# Patient Record
Sex: Male | Born: 1990 | Hispanic: Yes | Marital: Married | Smoking: Never smoker
Health system: Southern US, Community
[De-identification: ages and names within clinical notes are randomized; demographics above are authoritative.]

## PROBLEM LIST (undated history)

## (undated) DIAGNOSIS — K219 Gastro-esophageal reflux disease without esophagitis: Secondary | ICD-10-CM

## (undated) HISTORY — PX: LASIK: SHX215

## (undated) HISTORY — PX: KNEE ARTHROSCOPY: SUR90

---
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
## 2014-01-30 ENCOUNTER — Emergency Department (HOSPITAL_BASED_OUTPATIENT_CLINIC_OR_DEPARTMENT_OTHER): Payer: Commercial Managed Care - PPO

## 2014-01-30 ENCOUNTER — Emergency Department (HOSPITAL_BASED_OUTPATIENT_CLINIC_OR_DEPARTMENT_OTHER)
Admission: EM | Admit: 2014-01-30 | Discharge: 2014-01-30 | Disposition: A | Discharge status: Home / Self Care | Payer: Commercial Managed Care - PPO | Attending: Emergency Medicine | Admitting: Emergency Medicine

## 2014-01-30 ENCOUNTER — Encounter (HOSPITAL_BASED_OUTPATIENT_CLINIC_OR_DEPARTMENT_OTHER): Payer: Self-pay | Admitting: Emergency Medicine

## 2014-01-30 DIAGNOSIS — IMO0002 Reserved for concepts with insufficient information to code with codable children: Secondary | ICD-10-CM | POA: Insufficient documentation

## 2014-01-30 DIAGNOSIS — Y939 Activity, unspecified: Secondary | ICD-10-CM | POA: Insufficient documentation

## 2014-01-30 DIAGNOSIS — S8990XA Unspecified injury of unspecified lower leg, initial encounter: Secondary | ICD-10-CM

## 2014-01-30 DIAGNOSIS — Y9289 Other specified places as the place of occurrence of the external cause: Secondary | ICD-10-CM | POA: Insufficient documentation

## 2014-01-30 DIAGNOSIS — W230XXA Caught, crushed, jammed, or pinched between moving objects, initial encounter: Secondary | ICD-10-CM | POA: Insufficient documentation

## 2014-01-30 MED ORDER — OXYCODONE-ACETAMINOPHEN 5-325 MG PO TABS: 1.0000 | ORAL_TABLET | ORAL | Status: DC | PRN

## 2014-01-30 MED ORDER — IBUPROFEN 800 MG PO TABS: 800.0000 mg | ORAL_TABLET | Freq: Three times a day (TID) | ORAL | Status: DC

## 2014-01-30 MED ORDER — OXYCODONE-ACETAMINOPHEN 5-325 MG PO TABS
1.0000 | ORAL_TABLET | Freq: Once | ORAL | Status: AC
  Administered 2014-01-30: 1 via ORAL
  Filled 2014-01-30: qty 1
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
## 2015-09-23 ENCOUNTER — Ambulatory Visit: Payer: Worker's Compensation

## 2015-09-23 ENCOUNTER — Ambulatory Visit (INDEPENDENT_AMBULATORY_CARE_PROVIDER_SITE_OTHER): Payer: Worker's Compensation | Admitting: Family Medicine

## 2015-09-23 VITALS — BP 120/80 | HR 72 | Temp 98.2°F | Resp 18 | Ht 72.0 in | Wt 180.0 lb

## 2015-09-23 DIAGNOSIS — M25571 Pain in right ankle and joints of right foot: Secondary | ICD-10-CM

## 2015-09-23 DIAGNOSIS — S93401A Sprain of unspecified ligament of right ankle, initial encounter: Secondary | ICD-10-CM
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
## 2016-12-24 DIAGNOSIS — K219 Gastro-esophageal reflux disease without esophagitis: Secondary | ICD-10-CM | POA: Diagnosis not present

## 2017-04-02 DIAGNOSIS — J020 Streptococcal pharyngitis: Secondary | ICD-10-CM | POA: Diagnosis not present

## 2017-04-18 ENCOUNTER — Encounter: Payer: Self-pay | Admitting: Internal Medicine

## 2017-04-18 DIAGNOSIS — R12 Heartburn: Secondary | ICD-10-CM | POA: Diagnosis not present

## 2017-04-18 DIAGNOSIS — J3089 Other allergic rhinitis: Secondary | ICD-10-CM | POA: Diagnosis not present

## 2017-04-18 DIAGNOSIS — J029 Acute pharyngitis, unspecified: Secondary | ICD-10-CM | POA: Diagnosis not present

## 2017-06-04 ENCOUNTER — Ambulatory Visit (INDEPENDENT_AMBULATORY_CARE_PROVIDER_SITE_OTHER): Payer: 59 | Admitting: Internal Medicine

## 2017-06-04 ENCOUNTER — Encounter: Payer: Self-pay | Admitting: Internal Medicine

## 2017-06-04 VITALS — BP 108/72 | HR 72 | Ht 71.5 in | Wt 189.1 lb

## 2017-06-04 DIAGNOSIS — K219 Gastro-esophageal reflux disease without esophagitis: Secondary | ICD-10-CM | POA: Diagnosis not present

## 2017-06-04 MED ORDER — OMEPRAZOLE 40 MG PO CPDR: 40.0000 mg | DELAYED_RELEASE_CAPSULE | Freq: Every day | ORAL | 11 refills | Status: DC

## 2017-06-04 NOTE — Progress Notes (Signed)
HISTORY OF PRESENT ILLNESS:  Alexander Combs is a 26 y.o. male , field Hydrographic surveyorlaw enforcement officer, with no significant past medical history who presents today regarding problems with chronic GERD. The patient reports a 2-1/2 year history of significant GERD symptoms manifested by pyrosis, regurgitation, and occasional vomiting. Also associated throat clearing behavior. Had been using over-the-counter H2 receptor antagonist therapy without significant benefit. [facility] [address] and prescribed what sounds like PPI for 1 month. Told that if symptoms persisted or recurred thereafter to see a GI specialist. He arranges this appointment. Patient tells me that the medication was completely effective. He felt well. Recurrent symptoms off medication. He has symptoms daily and at night. No dysphagia. No weight loss. No family history of esophageal cancer. We did call the pharmacy that he recalls using to try to ascertain the exact medication, but they had no record. GI review of systems is otherwise negative  REVIEW OF SYSTEMS:  All non-GI ROS negative unless otherwise stated in the history of present illness  History reviewed. No pertinent past medical history.  Past Surgical History:  Procedure Laterality Date  . LASIK      Social History Hiroshi A Seydel  reports that he has quit smoking. He has never used smokeless tobacco. He reports that he drinks about 1.2 oz of alcohol per week . He reports that he does not use drugs.  family history includes Hypertension in his mother.  No Known Allergies     PHYSICAL EXAMINATION: Vital signs: BP 108/72   Pulse 72   Ht 5' 11.5" (1.816 m)   Wt 189 lb 2 oz (85.8 kg)   BMI 26.01 kg/m   Constitutional: generally well-appearing, no acute distress Psychiatric: alert and oriented x3, cooperative Eyes: extraocular movements intact, anicteric, conjunctiva pink Mouth: oral pharynx moist, no lesions Neck: supple no lymphadenopathy Cardiovascular:  heart regular rate and rhythm, no murmur Lungs: clear to auscultation bilaterally Abdomen: soft, nontender, nondistended, no obvious ascites, no peritoneal signs, normal bowel sounds, no organomegaly Rectal:Omitted Extremities: no clubbing cyanosis or lower extremity edema bilaterally Skin: no lesions on visible extremities Neuro: No focal deficits.   ASSESSMENT:  #1. Chronic GERD without alarm features   PLAN:  #1. Discussion on GERD with accompanying diagrams literature personally with the patient today #2. Reflux precautions. Reviewed #3. Prescribe omeprazole 40 mg daily. Multiple refills #4. GI follow-up 6 months. Contact the office in the interim for any questions or problems

## 2017-07-02 ENCOUNTER — Emergency Department (HOSPITAL_BASED_OUTPATIENT_CLINIC_OR_DEPARTMENT_OTHER)
Admission: EM | Admit: 2017-07-02 | Discharge: 2017-07-02 | Disposition: A | Discharge status: Home / Self Care | Payer: 59 | Attending: Emergency Medicine | Admitting: Emergency Medicine

## 2017-07-02 ENCOUNTER — Emergency Department (HOSPITAL_BASED_OUTPATIENT_CLINIC_OR_DEPARTMENT_OTHER): Payer: 59

## 2017-07-02 ENCOUNTER — Encounter (HOSPITAL_BASED_OUTPATIENT_CLINIC_OR_DEPARTMENT_OTHER): Payer: Self-pay

## 2017-07-02 DIAGNOSIS — Z87891 Personal history of nicotine dependence: Secondary | ICD-10-CM | POA: Insufficient documentation

## 2017-07-02 DIAGNOSIS — S52502A Unspecified fracture of the lower end of left radius, initial encounter for closed fracture: Secondary | ICD-10-CM

## 2017-07-02 DIAGNOSIS — S6992XA Unspecified injury of left wrist, hand and finger(s), initial encounter: Secondary | ICD-10-CM | POA: Diagnosis present

## 2017-07-02 DIAGNOSIS — Y998 Other external cause status: Secondary | ICD-10-CM | POA: Insufficient documentation

## 2017-07-02 DIAGNOSIS — Z79899 Other long term (current) drug therapy: Secondary | ICD-10-CM | POA: Diagnosis not present

## 2017-07-02 DIAGNOSIS — S52501A Unspecified fracture of the lower end of right radius, initial encounter for closed fracture: Secondary | ICD-10-CM | POA: Insufficient documentation

## 2017-07-02 DIAGNOSIS — W1789XA Other fall from one level to another, initial encounter: Secondary | ICD-10-CM | POA: Diagnosis not present

## 2017-07-02 DIAGNOSIS — Y92812 Truck as the place of occurrence of the external cause: Secondary | ICD-10-CM | POA: Diagnosis not present

## 2017-07-02 DIAGNOSIS — Y9389 Activity, other specified: Secondary | ICD-10-CM | POA: Diagnosis not present

## 2017-07-02 DIAGNOSIS — S52572A Other intraarticular fracture of lower end of left radius, initial encounter for closed fracture: Secondary | ICD-10-CM | POA: Diagnosis not present

## 2017-07-02 MED ORDER — MORPHINE SULFATE 15 MG PO TABS: 15.0000 mg | ORAL_TABLET | ORAL | 0 refills | Status: DC | PRN

## 2017-07-02 MED ORDER — MORPHINE SULFATE (PF) 4 MG/ML IV SOLN
4.0000 mg | Freq: Once | INTRAVENOUS | Status: AC
  Administered 2017-07-02: 4 mg via INTRAMUSCULAR
  Filled 2017-07-02: qty 1

## 2017-07-02 MED FILL — MORPHINE SULFATE IR 15 MG T: 15 | 4 days supply | Qty: 7 | Fill #0

## 2017-07-02 NOTE — Discharge Instructions (Signed)
Please call hand surgery today for an appointment. I will give Ibuprofen, Tylenol and Morphine for pain.

## 2017-07-02 NOTE — ED Notes (Signed)
ED Provider at bedside. Resident 
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
## 2017-07-07 DIAGNOSIS — S52512A Displaced fracture of left radial styloid process, initial encounter for closed fracture: Secondary | ICD-10-CM | POA: Diagnosis not present

## 2017-07-23 DIAGNOSIS — S52512D Displaced fracture of left radial styloid process, subsequent encounter for closed fracture with routine healing: Secondary | ICD-10-CM | POA: Diagnosis not present

## 2017-08-06 DIAGNOSIS — S52512D Displaced fracture of left radial styloid process, subsequent encounter for closed fracture with routine healing: Secondary | ICD-10-CM | POA: Diagnosis not present

## 2017-11-26 ENCOUNTER — Other Ambulatory Visit: Payer: Self-pay

## 2017-11-26 ENCOUNTER — Emergency Department (HOSPITAL_BASED_OUTPATIENT_CLINIC_OR_DEPARTMENT_OTHER)
Admission: EM | Admit: 2017-11-26 | Discharge: 2017-11-26 | Disposition: A | Discharge status: Home / Self Care | Payer: Worker's Compensation | Attending: Physician Assistant | Admitting: Physician Assistant

## 2017-11-26 ENCOUNTER — Emergency Department (HOSPITAL_BASED_OUTPATIENT_CLINIC_OR_DEPARTMENT_OTHER): Payer: Worker's Compensation

## 2017-11-26 ENCOUNTER — Encounter (HOSPITAL_BASED_OUTPATIENT_CLINIC_OR_DEPARTMENT_OTHER): Payer: Self-pay

## 2017-11-26 DIAGNOSIS — S80212A Abrasion, left knee, initial encounter: Secondary | ICD-10-CM | POA: Diagnosis not present

## 2017-11-26 DIAGNOSIS — W228XXA Striking against or struck by other objects, initial encounter: Secondary | ICD-10-CM | POA: Diagnosis not present

## 2017-11-26 DIAGNOSIS — S8992XA Unspecified injury of left lower leg, initial encounter: Secondary | ICD-10-CM | POA: Diagnosis present

## 2017-11-26 DIAGNOSIS — Y92411 Interstate highway as the place of occurrence of the external cause: Secondary | ICD-10-CM | POA: Diagnosis not present

## 2017-11-26 DIAGNOSIS — Y9389 Activity, other specified: Secondary | ICD-10-CM | POA: Insufficient documentation

## 2017-11-26 DIAGNOSIS — Z79899 Other long term (current) drug therapy: Secondary | ICD-10-CM | POA: Insufficient documentation

## 2017-11-26 DIAGNOSIS — S8002XA Contusion of left knee, initial encounter: Secondary | ICD-10-CM

## 2017-11-26 DIAGNOSIS — W19XXXA Unspecified fall, initial encounter: Secondary | ICD-10-CM

## 2017-11-26 DIAGNOSIS — Y990 Civilian activity done for income or pay: Secondary | ICD-10-CM | POA: Insufficient documentation

## 2017-11-26 HISTORY — DX: Gastro-esophageal reflux disease without esophagitis: K21.9

## 2017-11-26 NOTE — Discharge Instructions (Signed)
Please read and follow all provided instructions.  Your diagnoses today include:  1. Contusion of left knee, initial encounter   2. Fall   3. Abrasion of left knee, initial encounter     Tests performed today include:  An x-ray of the affected area - does NOT show any broken bones  Vital signs. See below for your results today.   Medications prescribed:   None  Take any prescribed medications only as directed.  Home care instructions:   Follow any educational materials contained in this packet  Follow R.I.C.E. Protocol:  R - rest your injury   I  - use ice on injury without applying directly to skin  C - compress injury with bandage or splint  E - elevate the injury as much as possible  Follow-up instructions: Please follow-up with your primary care provider if you continue to have significant pain in 1 week. In this case you may have a more severe injury that requires further care.   Return instructions:   Please return if your toes or feet are numb or tingling, appear gray or blue, or you have severe pain (also elevate the leg and loosen splint or wrap if you were given one)  Please return to the Emergency Department if you experience worsening symptoms.   Please return if you have any other emergent concerns.  Additional Information:  Your vital signs today were: BP 125/82 (BP Location: Right Arm)    Pulse 96    Temp 98.7 F (37.1 C) (Oral)    Resp 18    Ht 6\' 1"  (1.854 m)    Wt 83.9 kg (185 lb)    SpO2 99%    BMI 24.41 kg/m  If your blood pressure (BP) was elevated above 135/85 this visit, please have this repeated by your doctor within one month. --------------

## 2017-11-26 NOTE — ED Provider Notes (Signed)
[facility] EMERGENCY DEPARTMENT Provider Note   CSN: [id number] Arrival date & time: 11/26/17  1349     History   Chief Complaint Chief Complaint  Patient presents with  . Knee Injury    HPI Alexander Combs is a 27 y.o. male.  Patient presents to the ED with knee injury. 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 No treatments PTA. Ambulatory without difficulty. Pain is gradually increasing. No hip or ankle pain. Last tetanus within 5 years. The onset of this condition was acute. The course is constant.       Past Medical History:  Diagnosis Date  . GERD (gastroesophageal reflux disease)     There are no active problems to display for this patient.   Past Surgical History:  Procedure Laterality Date  . LASIK         Home Medications    Prior to Admission medications   Medication Sig Start Date End Date Taking? Authorizing Provider  omeprazole (PRILOSEC) 40 MG capsule Take 1 capsule (40 mg total) by mouth daily. 06/04/17   Hilarie Fredrickson, MD    Family History Family History  Problem Relation Age of Onset  . Hypertension Mother   . Colon cancer Neg Hx   . Rectal cancer Neg Hx   . Esophageal cancer Neg Hx   . Liver cancer Neg Hx     Social History Social History   Tobacco Use  . Smoking status: Never Smoker  . Smokeless tobacco: Never Used  Substance Use Topics  . Alcohol use: Yes    Comment: weekly  . Drug use: No     Allergies   Patient has no known allergies.   Review of Systems Review of Systems  Constitutional: Negative for activity change.  Musculoskeletal: Positive for arthralgias. Negative for back pain, gait problem, joint swelling and neck pain.  Skin: Positive for wound.  Neurological: Negative for weakness and numbness.     Physical Exam Updated Vital Signs BP 125/82 (BP Location: Right Arm)   Pulse 96   Temp 98.7 F (37.1 C) (Oral)   Resp 18    Ht 6\' 1"  (1.854 m)   Wt 83.9 kg (185 lb)   SpO2 99%   BMI 24.41 kg/m   Physical Exam  Constitutional: He appears well-developed and well-nourished.  HENT:  Head: Normocephalic and atraumatic.  Eyes: Conjunctivae are normal.  Neck: Normal range of motion. Neck supple.  Cardiovascular: Normal pulses. Exam reveals no decreased pulses.  Musculoskeletal: He exhibits tenderness. He exhibits no edema.       Left hip: He exhibits normal range of motion, normal strength and no tenderness.       Left knee: He exhibits normal range of motion, no swelling and no effusion. Tenderness found.       Left ankle: No tenderness.       Legs: Neurological: He is alert. No sensory deficit.  Motor, sensation, and vascular distal to the injury is fully intact.   Skin: Skin is warm and dry.  Psychiatric: He has a normal mood and affect.  Nursing note and vitals reviewed.    ED Treatments / Results  Labs (all labs ordered are listed, but only abnormal results are displayed) Labs Reviewed - No data to display  EKG  EKG Interpretation None       Radiology Dg Knee Complete 4 Views Left  Result Date: 11/26/2017 CLINICAL DATA:  27 y/o M; fall on knee with pain anteriorly just inferior to the patella and medial to the patella. Initial encounter. EXAM: LEFT KNEE - COMPLETE 4+ VIEW COMPARISON:  01/30/2014 left knee radiographs FINDINGS: No evidence of fracture, dislocation, or joint effusion. No evidence of arthropathy or other focal bone abnormality. Soft tissues are unremarkable. IMPRESSION: Negative. Electronically Signed   By: Mitzi HansenLance  Furusawa-Stratton M.D.   On: 11/26/2017 17:07    Procedures Procedures (including critical care time)  Medications Ordered in ED Medications - No data to display   Initial Impression / Assessment and Plan / ED Course  I have reviewed the triage vital signs and the nursing notes.  Pertinent labs & imaging results that were available during my care of the patient  were reviewed by me and considered in my medical decision making (see chart for details).     Patient seen and examined. X-ray ordered. Tetanus UTD.   Vital signs reviewed and are as follows: BP 125/82 (BP Location: Right Arm)   Pulse 96   Temp 98.7 F (37.1 C) (Oral)   Resp 18   Ht 6\' 1"  (1.854 m)   Wt 83.9 kg (185 lb)   SpO2 99%   BMI 24.41 kg/m   Patient updated on x-ray results.  Will dress wound.  Discussed rice protocol.  PCP follow-up 1 week if not improving.  Patient given note for his job.  Final Clinical Impressions(s) / ED Diagnoses   Final diagnoses:  Contusion of left knee, initial encounter  Abrasion of left knee, initial encounter   Patient with left knee abrasion and contusion after injury today.  Do not suspect tibial plateau fracture.  CMS distal to the injury is intact.  Patient is ambulatory.  Conservative measures indicated at this time.  ED Discharge Orders    None       Renne CriglerGeiple, Dewana Ammirati, PA-C 11/26/17 1750    Abelino DerrickMackuen, Courteney Lyn, MD 11/27/17 1433

## 2017-11-26 NOTE — ED Notes (Signed)
ED Provider at bedside. 
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
## 2018-05-28 DIAGNOSIS — R197 Diarrhea, unspecified: Secondary | ICD-10-CM | POA: Diagnosis not present

## 2018-05-29 ENCOUNTER — Encounter (HOSPITAL_BASED_OUTPATIENT_CLINIC_OR_DEPARTMENT_OTHER): Payer: Self-pay | Admitting: Emergency Medicine

## 2018-05-29 ENCOUNTER — Other Ambulatory Visit: Payer: Self-pay

## 2018-05-29 ENCOUNTER — Emergency Department (HOSPITAL_BASED_OUTPATIENT_CLINIC_OR_DEPARTMENT_OTHER)
Admission: EM | Admit: 2018-05-29 | Discharge: 2018-05-29 | Disposition: A | Discharge status: Home / Self Care | Payer: 59 | Attending: Emergency Medicine | Admitting: Emergency Medicine

## 2018-05-29 DIAGNOSIS — R319 Hematuria, unspecified: Secondary | ICD-10-CM

## 2018-05-29 DIAGNOSIS — R5381 Other malaise: Secondary | ICD-10-CM | POA: Insufficient documentation

## 2018-05-29 DIAGNOSIS — R030 Elevated blood-pressure reading, without diagnosis of hypertension: Secondary | ICD-10-CM

## 2018-05-29 DIAGNOSIS — R197 Diarrhea, unspecified: Secondary | ICD-10-CM | POA: Diagnosis present

## 2018-05-29 DIAGNOSIS — R509 Fever, unspecified: Secondary | ICD-10-CM | POA: Insufficient documentation

## 2018-05-29 DIAGNOSIS — Z79899 Other long term (current) drug therapy: Secondary | ICD-10-CM | POA: Insufficient documentation

## 2018-05-29 DIAGNOSIS — R51 Headache: Secondary | ICD-10-CM | POA: Insufficient documentation

## 2018-05-29 DIAGNOSIS — R252 Cramp and spasm: Secondary | ICD-10-CM | POA: Insufficient documentation

## 2018-05-29 LAB — CBC WITH DIFFERENTIAL/PLATELET
BASOS PCT: 0 %
Basophils Absolute: 0 10*3/uL (ref 0.0–0.1)
EOS ABS: 0 10*3/uL (ref 0.0–0.7)
Eosinophils Relative: 0 %
HCT: 39.9 % (ref 39.0–52.0)
Hemoglobin: 13.6 g/dL (ref 13.0–17.0)
Lymphocytes Relative: 12 %
Lymphs Abs: 0.8 10*3/uL (ref 0.7–4.0)
MCH: 27.8 pg (ref 26.0–34.0)
MCHC: 34.1 g/dL (ref 30.0–36.0)
MCV: 81.4 fL (ref 78.0–100.0)
Monocytes Absolute: 0.7 10*3/uL (ref 0.1–1.0)
Monocytes Relative: 10 %
Neutro Abs: 5.2 10*3/uL (ref 1.7–7.7)
Neutrophils Relative %: 78 %
PLATELETS: 156 10*3/uL (ref 150–400)
RBC: 4.9 MIL/uL (ref 4.22–5.81)
RDW: 12.5 % (ref 11.5–15.5)
WBC: 6.8 10*3/uL (ref 4.0–10.5)

## 2018-05-29 LAB — URINALYSIS, MICROSCOPIC (REFLEX)

## 2018-05-29 LAB — URINALYSIS, ROUTINE W REFLEX MICROSCOPIC
Bilirubin Urine: NEGATIVE
Glucose, UA: NEGATIVE mg/dL
KETONES UR: NEGATIVE mg/dL
LEUKOCYTES UA: NEGATIVE
Nitrite: NEGATIVE
Protein, ur: 30 mg/dL — AB
Specific Gravity, Urine: 1.025 (ref 1.005–1.030)
pH: 6 (ref 5.0–8.0)

## 2018-05-29 LAB — COMPREHENSIVE METABOLIC PANEL
ALBUMIN: 3.9 g/dL (ref 3.5–5.0)
ALT: 42 U/L (ref 0–44)
ANION GAP: 8 (ref 5–15)
AST: 39 U/L (ref 15–41)
Alkaline Phosphatase: 49 U/L (ref 38–126)
BUN: 9 mg/dL (ref 6–20)
CHLORIDE: 103 mmol/L (ref 98–111)
CO2: 25 mmol/L (ref 22–32)
Calcium: 8.3 mg/dL — ABNORMAL LOW (ref 8.9–10.3)
Creatinine, Ser: 1.02 mg/dL (ref 0.61–1.24)
GFR calc Af Amer: >60 mL/min (ref >60)
GFR calc non Af Amer: >60 mL/min (ref >60)
GLUCOSE: 108 mg/dL — AB (ref 70–99)
Potassium: 3.6 mmol/L (ref 3.5–5.1)
SODIUM: 136 mmol/L (ref 135–145)
TOTAL PROTEIN: 7.2 g/dL (ref 6.5–8.1)
Total Bilirubin: 0.5 mg/dL (ref 0.3–1.2)

## 2018-05-29 LAB — I-STAT CG4 LACTIC ACID, ED: Lactic Acid, Venous: 0.78 mmol/L (ref 0.5–1.9)

## 2018-05-29 MED ORDER — SODIUM CHLORIDE 0.9 % IV BOLUS
1000.0000 mL | Freq: Once | INTRAVENOUS | Status: AC
  Administered 2018-05-29: 1000 mL via INTRAVENOUS

## 2018-05-29 MED ORDER — AZITHROMYCIN 500 MG PO TABS: 500.0000 mg | ORAL_TABLET | Freq: Every day | ORAL | 0 refills | Status: AC

## 2018-05-29 MED FILL — AZITHROMYCIN 500 MG TABLET: 500 | 3 days supply | Qty: 3 | Fill #0

## 2018-05-29 NOTE — ED Notes (Signed)
ED Provider at bedside. 

## 2018-05-29 NOTE — Discharge Instructions (Addendum)
Please read and follow all provided instructions.  Your diagnoses today include:  1. Diarrhea, unspecified type   2. Elevated blood-pressure reading without diagnosis of hypertension   3. Hematuria, unspecified type   4. Hypocalcemia    You are prescribed a 3-day course of antibiotics for infectious diarrhea.  Your GI pathogen panel should be back later today or tomorrow.  If it is back today, I will contact you through the electronic medical record or by phone.  You can follow it in the electronic medical record on your MyChart when she create this account.  Please see the dilated portion of the front of your paperwork.  Tests performed today include: Blood counts and electrolytes Blood tests to check liver and kidney function Blood tests to check pancreas function Urine test to look for infection and pregnancy (in women) Vital signs. See below for your results today.   You have a small amount of blood in your urine.  This needs to be followed up when she establish care with a primary care provider.  Your calcium is slightly low today.  This might be related to you having profuse diarrhea.  Please replete with calcium rich foods such as dairy, vegetables, fish, and nuts once diarrhea is resolved.  Please avoid dairy while recovering from diarrheal illness.  Medications prescribed:   Take any prescribed medications only as directed.  Please take all of your antibiotics until finished.   You may develop abdominal discomfort or nausea from the antibiotic. If this occurs, you may take it with food. Some patients also get diarrhea with antibiotics. You may help offset this with probiotics which you can buy or get in yogurt. Do not eat or take the probiotics until 2 hours after your antibiotic. Some women develop vaginal yeast infections after antibiotics. If you develop unusual vaginal discharge after being on this medication, please see your primary care provider.   Some people develop  allergies to antibiotics. Symptoms of antibiotic allergy can be mild and include a flat rash and itching. They can also be more serious and include:  ?Hives - Hives are raised, red patches of skin that are usually very itchy.  ?Lip or tongue swelling  ?Trouble swallowing or breathing  ?Blistering of the skin or mouth.  If you have any of these serious symptoms, please seek emergency medical care immediately.   Home care instructions:  Follow any educational materials contained in this packet.  Your abdominal pain, nausea, vomiting, and diarrhea may be caused by a viral gastroenteritis also called 'stomach flu'. You should rest for the next several days. Keep drinking plenty of fluids and use the medicine for nausea as directed.   Drink clear liquids for the next 24 hours and introduce solid foods slowly after 24 hours using the b.r.a.t. diet (Bananas, Rice, Applesauce, Toast, Yogurt).    Follow-up instructions: Please follow-up with your primary care provider in the next 2 days for further evaluation of your symptoms. If you are not feeling better in 48 hours you may have a condition that is more serious and you need re-evaluation.   Return instructions:  SEEK IMMEDIATE MEDICAL ATTENTION IF: If you have pain that does not go away or becomes severe  A temperature above 101F develops  Repeated vomiting occurs (multiple episodes)  If you have pain that becomes localized to portions of the abdomen. The right side could possibly be appendicitis. In an adult, the left lower portion of the abdomen could be colitis or diverticulitis.  Blood is being passed in stools or vomit (bright red or black tarry stools)  You develop chest pain, difficulty breathing, dizziness or fainting, or become confused, poorly responsive, or inconsolable (young children) If you have any other emergent concerns regarding your health  Additional Information: Abdominal (belly) pain can be caused by many things. Your  caregiver performed an examination and possibly ordered blood/urine tests and imaging (CT scan, x-rays, ultrasound). Many cases can be observed and treated at home after initial evaluation in the emergency department. Even though you are being discharged home, abdominal pain can be unpredictable. Therefore, you need a repeated exam if your pain does not resolve, returns, or worsens. Most patients with abdominal pain don't have to be admitted to the hospital or have surgery, but serious problems like appendicitis and gallbladder attacks can start out as nonspecific pain. Many abdominal conditions cannot be diagnosed in one visit, so follow-up evaluations are very important.  Your vital signs today were: BP 139/71 (BP Location: Right Arm)    Pulse 86    Temp 98.6 F (37 C) (Oral)    Resp 16    Ht 6\' 1"  (1.854 m)    Wt 90.7 kg    SpO2 97%    BMI 26.39 kg/m  If your blood pressure (bp) was elevated above 135/85 this visit, please have this repeated by your doctor within one month. --------------

## 2018-05-29 NOTE — ED Provider Notes (Addendum)
[facility] EMERGENCY DEPARTMENT Provider Note   CSN: [id number] Arrival date & time: 05/29/18  1015     History   Chief Complaint Chief Complaint  Patient presents with  . Fever    diarrhea    HPI Alexander Combs is a 27 y.o. male.  HPI  Patient is a 27 year old male with a history of GERD presenting for diarrhea for 4 days.  Patient reports that symptoms began 4 days ago with generalized malaise as well as 6+ stools per day.  Patient describes that stools are mostly watery with intermittent soft stool.  Patient reports his stools mostly, at night, and he has had 10-12 stools yesterday. Patient reports that secondary to his illness, he developed fevers, and his fever has gotten as high as 103.4 couple days ago.  Patient denies nausea or vomiting.  Patient reports abdominal cramping prior to experience in the diarrhea, however it resolves after patient uses the restroom.  Patient reports seeing a streak of blood in the stool yesterday.  Patient describes diffuse joint pain, that his overall improved on presentation today.  Patient also notes mild frontal headache since the onset of his symptoms.  Patient denies any sore throat, cough, congestion or rhinorrhea.  Patient denies any known sick contacts.  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  Patient denies any recent travel.  Past Medical History:  Diagnosis Date  . GERD (gastroesophageal reflux disease)     There are no active problems to display for this patient.   Past Surgical History:  Procedure Laterality Date  . LASIK          Home Medications    Prior to Admission medications   Medication Sig Start Date End Date Taking? Authorizing Provider  omeprazole (PRILOSEC) 40 MG capsule Take 1 capsule (40 mg total) by mouth daily. 06/04/17  Yes Hilarie FredricksonPerry, John N, MD    Family History Family History  Problem Relation Age of Onset  . Hypertension Mother   .  Colon cancer Neg Hx   . Rectal cancer Neg Hx   . Esophageal cancer Neg Hx   . Liver cancer Neg Hx     Social History Social History   Tobacco Use  . Smoking status: Never Smoker  . Smokeless tobacco: Never Used  Substance Use Topics  . Alcohol use: Yes    Comment: weekly  . Drug use: No     Allergies   Patient has no known allergies.   Review of Systems Review of Systems  Constitutional: Negative for chills and fever.  HENT: Negative for congestion, rhinorrhea, sinus pain and sore throat.   Eyes: Negative for visual disturbance.  Respiratory: Negative for cough, chest tightness and shortness of breath.   Cardiovascular: Negative for chest pain, palpitations and leg swelling.  Gastrointestinal: Positive for blood in stool and diarrhea. Negative for abdominal pain, constipation, nausea and vomiting.  Genitourinary: Negative for dysuria and flank pain.  Musculoskeletal: Negative for back pain and myalgias.  Skin: Negative for rash.  Neurological: Negative for dizziness, syncope, light-headedness and headaches.     Physical Exam Updated Vital Signs BP 138/82 (BP Location: Right Arm)   Pulse (!) 103   Temp 100.2 F (37.9 C) (Oral)   Resp 18   Ht 6\' 1"  (1.854 m)   Wt 90.7 kg   SpO2 98%   BMI 26.39 kg/m   Physical Exam  Constitutional: He appears well-developed  and well-nourished. No distress.  HENT:  Head: Normocephalic and atraumatic.  Mouth/Throat: Oropharynx is clear and moist.  Eyes: Pupils are equal, round, and reactive to light. Conjunctivae and EOM are normal.  Neck: Normal range of motion. Neck supple.  Cardiovascular: Normal rate, regular rhythm, S1 normal and S2 normal.  No murmur heard. No tachycardia.  Pulmonary/Chest: Effort normal and breath sounds normal. He has no wheezes. He has no rales.  Abdominal: Soft. Bowel sounds are normal. He exhibits no distension. There is no tenderness. There is no rebound and no guarding.  Musculoskeletal: Normal  range of motion. He exhibits no edema or deformity.  Lymphadenopathy:    He has no cervical adenopathy.  Neurological: He is alert.  Cranial nerves grossly intact. Patient moves extremities symmetrically and with good coordination.  Skin: Skin is warm and dry. No rash noted. No erythema.  Psychiatric: He has a normal mood and affect. His behavior is normal. Judgment and thought content normal.  Nursing note and vitals reviewed.    ED Treatments / Results  Labs (all labs ordered are listed, but only abnormal results are displayed) Labs Reviewed  COMPREHENSIVE METABOLIC PANEL - Abnormal; Notable for the following components:      Result Value   Glucose, Bld 108 (*)    Calcium 8.3 (*)    All other components within normal limits  URINALYSIS, ROUTINE W REFLEX MICROSCOPIC - Abnormal; Notable for the following components:   Hgb urine dipstick TRACE (*)    Protein, ur 30 (*)    All other components within normal limits  URINALYSIS, MICROSCOPIC (REFLEX) - Abnormal; Notable for the following components:   Bacteria, UA RARE (*)    All other components within normal limits  GASTROINTESTINAL PANEL BY PCR, STOOL (REPLACES STOOL CULTURE)  CBC WITH DIFFERENTIAL/PLATELET  I-STAT CG4 LACTIC ACID, ED    EKG None  Radiology No results found.  Procedures Procedures (including critical care time)  Medications Ordered in ED Medications  sodium chloride 0.9 % bolus 1,000 mL (1,000 mLs Intravenous New Bag/Given 05/29/18 1257)     Initial Impression / Assessment and Plan / ED Course  I have reviewed the triage vital signs and the nursing notes.  Pertinent labs & imaging results that were available during my care of the patient were reviewed by me and considered in my medical decision making (see chart for details).  Clinical Course as of May 29 1532  Fri May 29, 2018  1217 Will discuss hemoglobinuria.  Hgb urine dipstick(!): TRACE [AM]  1236 Will discuss with patient.   Calcium(!): 8.3  [AM]  1257 Pt reports being febrile earlier today.  Temp: 100.2 F (37.9 C) [AM]  1443 Reassessed.  No complaints. In full understanding of DC instructions and plan of care.  Discussed with patient that I will contact him if his medical record or by phone when I have the results of his GIP back.   [AM]    Clinical Course User Index [AM] Elisha Ponder, PA-C    Patient nontoxic-appearing, afebrile, and with benign abdomen.  Differential diagnosis includes infectious diarrhea, inflammatory diarrhea, diverticulitis.  I have low suspicion for diverticulitis, due to young patient age, lack of persistent abdominal pain or focal abdominal pain, and with watery diarrhea being the persistent symptom.  Given the prolonged symptoms, greater than 6 stools per day, and fever without other clear source of fever, do have suspicion for bacterial diarrhea.  Will send GI panel.   Normal renal function today.  No electrolyte disturbances other than slight hypocalcemia, which I mentioned to patient.  Patient had slight microscopic hematuria, will transfer he needs to follow-up with primary care.  Patient had improvement of symptoms, resolution of fever, and resolution of tachycardia during emergency department course after fluid repletion.  Given that time course of symptoms, fever, and blood in stool, will treat for infectious diarrhea.  GI panel pending and patient to follow up.   Patient was given return precautions for any increasing pain, blood in stool, feeling weak, diaphoretic, or dehydrated.  Patient is in understanding and agrees with the plan of care.  Final Clinical Impressions(s) / ED Diagnoses   Final diagnoses:  Diarrhea, unspecified type  Elevated blood-pressure reading without diagnosis of hypertension  Hematuria, unspecified type  Hypocalcemia    ED Discharge Orders         Ordered    azithromycin (ZITHROMAX) 500 MG tablet  Daily     05/29/18 1445            Delia Chimes 05/29/18 2046    Tilden Fossa, MD 05/30/18 [phone]

## 2018-05-29 NOTE — ED Triage Notes (Addendum)
Since Tuesday night started having diarrhea , denies abd pain at present . Has had a fever since Wed, as high as 103. Was seen by [facility] and was dx with flu and told to take imodium with no relief . Denies vomiting but has joint pain and h/a. States symptoms started after working outside all day on Monday

## 2018-05-29 NOTE — ED Notes (Signed)
Pt provided with hat for stool specimen

## 2018-05-30 ENCOUNTER — Encounter (HOSPITAL_BASED_OUTPATIENT_CLINIC_OR_DEPARTMENT_OTHER): Payer: Self-pay | Admitting: Emergency Medicine

## 2018-05-30 ENCOUNTER — Other Ambulatory Visit: Payer: Self-pay

## 2018-05-30 ENCOUNTER — Emergency Department (HOSPITAL_BASED_OUTPATIENT_CLINIC_OR_DEPARTMENT_OTHER)
Admission: EM | Admit: 2018-05-30 | Discharge: 2018-05-30 | Disposition: A | Discharge status: Home / Self Care | Payer: 59 | Attending: Emergency Medicine | Admitting: Emergency Medicine

## 2018-05-30 ENCOUNTER — Telehealth (HOSPITAL_BASED_OUTPATIENT_CLINIC_OR_DEPARTMENT_OTHER): Payer: Self-pay | Admitting: Emergency Medicine

## 2018-05-30 ENCOUNTER — Encounter (HOSPITAL_BASED_OUTPATIENT_CLINIC_OR_DEPARTMENT_OTHER): Payer: Self-pay

## 2018-05-30 DIAGNOSIS — A029 Salmonella infection, unspecified: Secondary | ICD-10-CM

## 2018-05-30 DIAGNOSIS — Z79899 Other long term (current) drug therapy: Secondary | ICD-10-CM | POA: Diagnosis not present

## 2018-05-30 DIAGNOSIS — R197 Diarrhea, unspecified: Secondary | ICD-10-CM | POA: Diagnosis not present

## 2018-05-30 LAB — GASTROINTESTINAL PANEL BY PCR, STOOL (REPLACES STOOL CULTURE)
ASTROVIRUS: NOT DETECTED
Adenovirus F40/41: NOT DETECTED
CAMPYLOBACTER SPECIES: NOT DETECTED
Cryptosporidium: NOT DETECTED
Cyclospora cayetanensis: NOT DETECTED
ENTEROAGGREGATIVE E COLI (EAEC): NOT DETECTED
ENTEROTOXIGENIC E COLI (ETEC): NOT DETECTED
Entamoeba histolytica: NOT DETECTED
Enteropathogenic E coli (EPEC): NOT DETECTED
GIARDIA LAMBLIA: NOT DETECTED
Norovirus GI/GII: NOT DETECTED
Plesimonas shigelloides: NOT DETECTED
ROTAVIRUS A: NOT DETECTED
Salmonella species: DETECTED — AB
Sapovirus (I, II, IV, and V): NOT DETECTED
Shiga like toxin producing E coli (STEC): NOT DETECTED
Shigella/Enteroinvasive E coli (EIEC): NOT DETECTED
Vibrio cholerae: NOT DETECTED
Vibrio species: NOT DETECTED
Yersinia enterocolitica: NOT DETECTED

## 2018-05-30 MED ORDER — CIPROFLOXACIN HCL 500 MG PO TABS: 500.0000 mg | ORAL_TABLET | Freq: Two times a day (BID) | ORAL | 0 refills | Status: AC

## 2018-05-30 NOTE — Discharge Instructions (Signed)
You were given a prescription for antibiotics. Please take the antibiotic prescription fully.   Please follow up with your primary care provider within 5-7 days for re-evaluation of your symptoms. If you do not have a primary care provider, information for a healthcare clinic has been provided for you to make arrangements for follow up care. Please return to the emergency department for any new or worsening symptoms.  

## 2018-05-30 NOTE — ED Provider Notes (Signed)
[facility] EMERGENCY DEPARTMENT Provider Note   CSN: [id number] Arrival date & time: 05/30/18  1330     History   Chief Complaint Chief Complaint  Patient presents with  . Diarrhea    HPI Alexander Combs is a 27 y.o. male.  HPI   Patient is a 27 year old male with a history of GERD who presents emergency department today for evaluation of persistent diarrhea.  He was seen in the ED yesterday and diagnosed with a suspected bacterial diarrhea.  He was started on Zithromax.  He states he has taken the prescribed doses and he has had persistent diarrhea.  He states that he has had 10 episodes since he woke up this morning.  He feels somewhat with fatigue.  He denies any continued fevers.  No blood in his stool.  Has some crampy abdominal pain that is generalized.  No vomiting or constipation.  Denies any other symptoms.  A GI panel was sent from the emergency department yesterday and came back positive for Salmonella.  He was contacted by the hospital and informed that if he is still having symptoms he should be seen in the emergency department.  Past Medical History:  Diagnosis Date  . GERD (gastroesophageal reflux disease)     There are no active problems to display for this patient.   Past Surgical History:  Procedure Laterality Date  . LASIK          Home Medications    Prior to Admission medications   Medication Sig Start Date End Date Taking? Authorizing Provider  azithromycin (ZITHROMAX) 500 MG tablet Take 1 tablet (500 mg total) by mouth daily for 3 days. 05/29/18 06/01/18 Yes Murray, Alyssa B, PA-C  ibuprofen (ADVIL,MOTRIN) 200 MG tablet Take 200 mg by mouth every 6 (six) hours as needed.   Yes [provider]  ciprofloxacin (CIPRO) 500 MG tablet Take 1 tablet (500 mg total) by mouth every 12 (twelve) hours for 5 days. 05/30/18 06/04/18  Landa Mullinax S, PA-C  omeprazole (PRILOSEC) 40 MG capsule Take 1 capsule (40 mg total) by mouth daily. 06/04/17    Hilarie Fredrickson, MD    Family History Family History  Problem Relation Age of Onset  . Hypertension Mother   . Colon cancer Neg Hx   . Rectal cancer Neg Hx   . Esophageal cancer Neg Hx   . Liver cancer Neg Hx     Social History Social History   Tobacco Use  . Smoking status: Never Smoker  . Smokeless tobacco: Never Used  Substance Use Topics  . Alcohol use: Yes    Comment: weekly  . Drug use: No     Allergies   Patient has no known allergies.   Review of Systems Review of Systems  Constitutional: Negative for chills and fever.  HENT: Negative for congestion, rhinorrhea and sore throat.   Eyes: Negative for visual disturbance.  Respiratory: Negative for shortness of breath.   Cardiovascular: Negative for chest pain.  Gastrointestinal: Positive for abdominal pain and diarrhea. Negative for blood in stool, constipation and vomiting.  Genitourinary: Negative for dysuria, flank pain, frequency, hematuria and urgency.  Musculoskeletal: Negative for back pain.  Skin: Negative for rash.  Neurological: Negative for headaches.    Physical Exam Updated Vital Signs BP 130/78 (BP Location: Right Arm)   Pulse 87   Temp 98.8 F (37.1 C) (Oral)   Resp 18   Ht 6\' 1"  (1.854 m)   Wt 90.7 kg  SpO2 99%   BMI 26.38 kg/m   Physical Exam  Constitutional: He appears well-developed and well-nourished.  HENT:  Head: Normocephalic and atraumatic.  Mucous membranes are dry.  Eyes: Conjunctivae are normal.  Neck: Neck supple.  Cardiovascular: Normal rate and regular rhythm.  No murmur heard. Pulmonary/Chest: Effort normal and breath sounds normal. No respiratory distress.  Abdominal: Soft. There is tenderness (generalized, mild).  Hyperactive bowel sounds.  No rebound tenderness or guarding.  Musculoskeletal: He exhibits no edema.  Neurological: He is alert.  Skin: Skin is warm and dry.  Psychiatric: He has a normal mood and affect.  Nursing note and vitals  reviewed.    ED Treatments / Results  Labs (all labs ordered are listed, but only abnormal results are displayed) Labs Reviewed - No data to display  EKG None  Radiology No results found.  Procedures Procedures (including critical care time)  Medications Ordered in ED Medications - No data to display   Initial Impression / Assessment and Plan / ED Course  I have reviewed the triage vital signs and the nursing notes.  Pertinent labs & imaging results that were available during my care of the patient were reviewed by me and considered in my medical decision making (see chart for details).    Discussed pt presentation and exam findings with Dr. Deretha EmoryZackowski, who advised to change that patients antibiotic to ciprofloxacin.   Final Clinical Impressions(s) / ED Diagnoses   Final diagnoses:  Salmonella   Patient presented to ED with persistent diarrhea for the last 5 days.  Nonbloody.  Initially had fevers which have improved.  Vitals stable here without fevers.  Does not appear to be grossly dehydrated.  He was offered IV fluid hydration as well as lab work to assess his electrolytes however he prefers to forego these tests and treatments at this time.  He states he will hydrate himself at home with p.o. hydration.  We will change his antibiotic to ciprofloxacin and have him follow-up with his primary doctor in 1 week for reevaluation.  Discussed return precautions and patient voices understanding plan reasons return.  All questions answered.  ED Discharge Orders         Ordered    ciprofloxacin (CIPRO) 500 MG tablet  Every 12 hours     05/30/18 [address]1506           Aloysius Heinle S, PA-C 05/30/18 1508    Vanetta MuldersZackowski, Scott, MD 06/02/18 [phone]

## 2018-05-30 NOTE — ED Triage Notes (Signed)
Pt seen yesterday with diarrhea and fever. He was told to come back due to stool sample testing positive for salmonella. States diarrhea continues but has not had a fever since yesterday.

## 2018-05-30 NOTE — ED Notes (Signed)
Pt in bathroom

## 2018-06-07 ENCOUNTER — Other Ambulatory Visit: Payer: Self-pay | Admitting: Internal Medicine

## 2018-06-30 ENCOUNTER — Emergency Department (HOSPITAL_BASED_OUTPATIENT_CLINIC_OR_DEPARTMENT_OTHER)
Admission: EM | Admit: 2018-06-30 | Discharge: 2018-06-30 | Disposition: A | Discharge status: Home / Self Care | Payer: No Typology Code available for payment source | Attending: Emergency Medicine | Admitting: Emergency Medicine

## 2018-06-30 ENCOUNTER — Other Ambulatory Visit: Payer: Self-pay

## 2018-06-30 ENCOUNTER — Encounter (HOSPITAL_BASED_OUTPATIENT_CLINIC_OR_DEPARTMENT_OTHER): Payer: Self-pay | Admitting: Emergency Medicine

## 2018-06-30 ENCOUNTER — Emergency Department (HOSPITAL_BASED_OUTPATIENT_CLINIC_OR_DEPARTMENT_OTHER): Payer: No Typology Code available for payment source

## 2018-06-30 DIAGNOSIS — Z79899 Other long term (current) drug therapy: Secondary | ICD-10-CM | POA: Insufficient documentation

## 2018-06-30 DIAGNOSIS — Y929 Unspecified place or not applicable: Secondary | ICD-10-CM | POA: Diagnosis not present

## 2018-06-30 DIAGNOSIS — W548XXA Other contact with dog, initial encounter: Secondary | ICD-10-CM | POA: Diagnosis not present

## 2018-06-30 DIAGNOSIS — Y9389 Activity, other specified: Secondary | ICD-10-CM | POA: Diagnosis not present

## 2018-06-30 DIAGNOSIS — Y990 Civilian activity done for income or pay: Secondary | ICD-10-CM | POA: Insufficient documentation

## 2018-06-30 DIAGNOSIS — S6991XA Unspecified injury of right wrist, hand and finger(s), initial encounter: Secondary | ICD-10-CM | POA: Insufficient documentation

## 2018-06-30 NOTE — ED Provider Notes (Signed)
[facility] EMERGENCY DEPARTMENT Provider Note   CSN: [id number] Arrival date & time: 06/30/18  1259     History   Chief Complaint Chief Complaint  Patient presents with  . Hand Injury    HPI Alexander Combs is a 27 y.o. male who presents to the emergency department status post right hand injury which occurred shortly prior to arrival.  Patient works in the [employer] department, a dog was charging him, he subsequently punched the dog with his R hand to avoid further injury to himself or the animal.  The patient did not have direct contact with the dog's teeth/mouth area, no open wounds resulting from this injury.  He is having pain to the right fifth digit/ulnar aspect of the hand, current discomfort is a 3/10 in severity, worse with movement and palpation, no alleviating factors, no intervention PTA. Denies other areas of injury. Denies numbness, paresthesias, or weakness. Patient is R hand dominant.   HPI  Past Medical History:  Diagnosis Date  . GERD (gastroesophageal reflux disease)     There are no active problems to display for this patient.   Past Surgical History:  Procedure Laterality Date  . LASIK          Home Medications    Prior to Admission medications   Medication Sig Start Date End Date Taking? Authorizing Provider  ibuprofen (ADVIL,MOTRIN) 200 MG tablet Take 200 mg by mouth every 6 (six) hours as needed.    [provider]  omeprazole (PRILOSEC) 40 MG capsule TAKE 1 CAPSULE BY MOUTH EVERY DAY 06/08/18   Hilarie Fredrickson, MD    Family History Family History  Problem Relation Age of Onset  . Hypertension Mother   . Colon cancer Neg Hx   . Rectal cancer Neg Hx   . Esophageal cancer Neg Hx   . Liver cancer Neg Hx     Social History Social History   Tobacco Use  . Smoking status: Never Smoker  . Smokeless tobacco: Never Used  Substance Use Topics  . Alcohol use: Yes    Comment: weekly  . Drug use: No     Allergies   Patient  has no known allergies.   Review of Systems Review of Systems  Constitutional: Negative for chills and fever.  Musculoskeletal: Positive for arthralgias (R hand/5th digit pain).  Skin: Negative for wound.  Neurological: Negative for weakness and numbness.     Physical Exam Updated Vital Signs BP (!) 130/94 (BP Location: Left Arm)   Pulse 83   Temp 98.2 F (36.8 C) (Oral)   Resp 18   Ht 6\' 1"  (1.854 m)   Wt 90.7 kg   SpO2 97%   BMI 26.39 kg/m   Physical Exam  Constitutional: He appears well-developed and well-nourished. No distress.  HENT:  Head: Normocephalic and atraumatic.  Eyes: Conjunctivae are normal. Right eye exhibits no discharge. Left eye exhibits no discharge.  Cardiovascular:  Pulses:      Radial pulses are 2+ on the right side, and 2+ on the left side.  Musculoskeletal:  Upper Extremities: No obvious deformity, appreciable swelling, erythema, ecchymosis, or open wounds.  Patient has full active range of motion to bilateral elbows, wrist, and all digits including IP and MCP joints.  He has some discomfort with MCP/IP flexion/extension of the R fifth digit able to fully perform each of these motions.  He is tender to palpation over the R 5th proximal phalanx, MCP, and metacarpal.  Neurovascularly intact distally.  No other tenderness to the upper extremities.  No snuffbox tenderness.  Neurological: He is alert.  Clear speech.  Sensation grossly intact bilateral upper extremities.  5 out of 5 symmetric grip strength.  Skin: Capillary refill takes less than 2 seconds. No abrasion, no bruising, no ecchymosis and no laceration noted.  Psychiatric: He has a normal mood and affect. His behavior is normal. Thought content normal.  Nursing note and vitals reviewed.    ED Treatments / Results  Labs (all labs ordered are listed, but only abnormal results are displayed) Labs Reviewed - No data to display  EKG None  Radiology Dg Hand Complete Right  Result Date:  06/30/2018 CLINICAL DATA:  Medial hand pain after punching a dog EXAM: RIGHT HAND - COMPLETE 3+ VIEW COMPARISON:  None. FINDINGS: There is no evidence of fracture or dislocation. There is no evidence of arthropathy or other focal bone abnormality. Soft tissues are unremarkable. IMPRESSION: No acute osseous injury of the right hand. Electronically Signed   By: Elige Ko   On: 06/30/2018 13:33    Procedures Procedures (including critical care time)  Medications Ordered in ED Medications - No data to display   Initial Impression / Assessment and Plan / ED Course  I have reviewed the triage vital signs and the nursing notes.  Pertinent labs & imaging results that were available during my care of the patient were reviewed by me and considered in my medical decision making (see chart for details).   Patient presents the emergency department status post right hand injury complaining of discomfort.  Exam notable for tenderness over the right fifth proximal phalanx metacarpal area.  X-ray obtained and negative for fracture or dislocation.  Patient is neurovascularly intact distally with full active ROM.  No open wounds. Offered therapeutic splint, patient declined. PRICE and motrin recommended. I discussed results, treatment plan, need for PCP vs. Sports med follow-up, and return precautions with the patient. Provided opportunity for questions, patient confirmed understanding and is in agreement with plan.    Final Clinical Impressions(s) / ED Diagnoses   Final diagnoses:  Injury of right hand, initial encounter    ED Discharge Orders    None       Cherly Anderson, PA-C 06/30/18 1347    Mesner, Barbara Cower, MD 07/01/18 2047

## 2018-06-30 NOTE — ED Triage Notes (Signed)
Pt works for the [employer] and punched a dog that was charging him. C/o R hand pain.

## 2018-06-30 NOTE — Discharge Instructions (Signed)
Please read and follow all provided instructions.  You have been seen today for an injury to your right hand.   Tests performed today include: An x-ray of the affected area - does NOT show any broken bones or dislocations.  Vital signs. See below for your results today.   Home care instructions: -- *PRICE in the first 24-48 hours after injury: Protect (with brace, splint, sling), if given by your provider-you may purchase a finger splint if you wish to help with stabilization/discomfort.  Rest Ice- Do not apply ice pack directly to your skin, place towel or similar between your skin and ice/ice pack. Apply ice for 20 min, then remove for 40 min while awake Compression- Wear brace, elastic bandage, splint as directed by your provider Elevate affected extremity above the level of your heart when not walking around for the first 24-48 hours   Medications:  Please take ibuprofen 600 mg every 6 hours as needed for pain and swelling.  You may also take Tylenol per over-the-counter dosing.  Follow-up instructions: Please follow-up with your primary care provider or the provided orthopedic physician (bone specialist) if you continue to have significant pain in 1 week. In this case you may have a more severe injury that requires further care.   Return instructions:  Please return if your digits or extremity are numb or tingling, appear gray or blue, or you have severe pain (also elevate the extremity and loosen splint or wrap if you were given one) Please return if you have redness or fevers.  Please return to the Emergency Department if you experience worsening symptoms.  Please return if you have any other emergent concerns. Additional Information:  Your vital signs today were: BP (!) 130/94 (BP Location: Left Arm)    Pulse 83    Temp 98.2 F (36.8 C) (Oral)    Resp 18    Ht 6\' 1"  (1.854 m)    Wt 90.7 kg    SpO2 97%    BMI 26.39 kg/m  If your blood pressure (BP) was elevated above 135/85  this visit, please have this repeated by your doctor within one month. ---------------

## 2018-11-30 ENCOUNTER — Other Ambulatory Visit: Payer: Self-pay | Admitting: Internal Medicine

## 2018-12-25 DIAGNOSIS — T1502XA Foreign body in cornea, left eye, initial encounter: Secondary | ICD-10-CM | POA: Diagnosis not present

## 2018-12-28 ENCOUNTER — Other Ambulatory Visit: Payer: Self-pay

## 2018-12-28 DIAGNOSIS — S0502XD Injury of conjunctiva and corneal abrasion without foreign body, left eye, subsequent encounter: Secondary | ICD-10-CM | POA: Diagnosis not present

## 2018-12-28 MED ORDER — OMEPRAZOLE 40 MG PO CPDR: 40.0000 mg | DELAYED_RELEASE_CAPSULE | Freq: Every day | ORAL | 1 refills | Status: DC

## 2019-04-12 ENCOUNTER — Emergency Department (HOSPITAL_COMMUNITY)
Admission: EM | Admit: 2019-04-12 | Discharge: 2019-04-12 | Disposition: A | Discharge status: Home / Self Care | Payer: No Typology Code available for payment source | Attending: Emergency Medicine | Admitting: Emergency Medicine

## 2019-04-12 ENCOUNTER — Other Ambulatory Visit: Payer: Self-pay

## 2019-04-12 ENCOUNTER — Encounter (HOSPITAL_COMMUNITY): Payer: Self-pay | Admitting: Emergency Medicine

## 2019-04-12 DIAGNOSIS — W3400XA Accidental discharge from unspecified firearms or gun, initial encounter: Secondary | ICD-10-CM | POA: Insufficient documentation

## 2019-04-12 DIAGNOSIS — Y998 Other external cause status: Secondary | ICD-10-CM | POA: Diagnosis not present

## 2019-04-12 DIAGNOSIS — S71101A Unspecified open wound, right thigh, initial encounter: Secondary | ICD-10-CM

## 2019-04-12 DIAGNOSIS — Y9389 Activity, other specified: Secondary | ICD-10-CM | POA: Diagnosis not present

## 2019-04-12 DIAGNOSIS — Y9289 Other specified places as the place of occurrence of the external cause: Secondary | ICD-10-CM | POA: Diagnosis not present

## 2019-04-12 DIAGNOSIS — Z23 Encounter for immunization: Secondary | ICD-10-CM | POA: Insufficient documentation

## 2019-04-12 MED ORDER — CEPHALEXIN 500 MG PO CAPS: 500.0000 mg | ORAL_CAPSULE | Freq: Two times a day (BID) | ORAL | 0 refills | Status: DC

## 2019-04-12 MED ORDER — TETANUS-DIPHTH-ACELL PERTUSSIS 5-2.5-18.5 LF-MCG/0.5 IM SUSP
0.5000 mL | Freq: Once | INTRAMUSCULAR | Status: AC
  Administered 2019-04-12: 0.5 mL via INTRAMUSCULAR
  Filled 2019-04-12: qty 0.5

## 2019-04-12 NOTE — ED Triage Notes (Signed)
Onset today while at work using guns with rounds made up of clay and steel powder. Abrasion and laceration right lateral lower extremity. Bleeding controlled with bandage prior to arrival. Pain 3/10 achy throbbing. Unknown last tetanus.

## 2019-04-12 NOTE — ED Provider Notes (Signed)
[facility] Provider Note   CSN: [id number] Arrival date & time: 04/12/19  1702    History   Chief Complaint Chief Complaint  Patient presents with  . Abrasion  . Laceration    HPI Alexander Combs is a 28 y.o. male.     HPI   28 year old male presents today with complaints of wound.  Patient notes that he was reaching a door with a firearm.  He notes the rounds are made up of clay and metal.  He notes this shot back in him causing a wound to his right lateral thigh.  He notes he cleansed this thoroughly with water and soap prior to evaluation.  He denies any significant pain, no other injuries.  Diabetes or significant skin infectious history.  He is uncertain when his last tetanus vaccination was.  Past Medical History:  Diagnosis Date  . GERD (gastroesophageal reflux disease)     There are no active problems to display for this patient.   Past Surgical History:  Procedure Laterality Date  . LASIK          Home Medications    Prior to Admission medications   Medication Sig Start Date End Date Taking? Authorizing Provider  cephALEXin (KEFLEX) 500 MG capsule Take 1 capsule (500 mg total) by mouth 2 (two) times daily. 04/12/19   Secilia Apps, Tinnie GensJeffrey, PA-C  ibuprofen (ADVIL,MOTRIN) 200 MG tablet Take 200 mg by mouth every 6 (six) hours as needed.    [provider]  omeprazole (PRILOSEC) 40 MG capsule Take 1 capsule (40 mg total) by mouth daily. Patient needs office visit for further refills 12/28/18   Hilarie FredricksonPerry, John N, MD    Family History Family History  Problem Relation Age of Onset  . Hypertension Mother   . Colon cancer Neg Hx   . Rectal cancer Neg Hx   . Esophageal cancer Neg Hx   . Liver cancer Neg Hx     Social History Social History   Tobacco Use  . Smoking status: Never Smoker  . Smokeless tobacco: Never Used  Substance Use Topics  . Alcohol use: Yes    Comment: weekly  . Drug use: No     Allergies    Patient has no known allergies.   Review of Systems Review of Systems  All other systems reviewed and are negative.    Physical Exam Updated Vital Signs BP (!) 149/81 (BP Location: Right Arm)   Pulse 100   Temp 99 F (37.2 C) (Oral)   Resp 18   Ht 6' (1.829 m)   Wt 90 kg   SpO2 98%   BMI 26.91 kg/m   Physical Exam Vitals signs and nursing note reviewed.  Constitutional:      Appearance: He is well-developed.  HENT:     Head: Normocephalic and atraumatic.  Eyes:     General: No scleral icterus.       Right eye: No discharge.        Left eye: No discharge.     Conjunctiva/sclera: Conjunctivae normal.     Pupils: Pupils are equal, round, and reactive to light.  Neck:     Musculoskeletal: Normal range of motion.     Vascular: No JVD.     Trachea: No tracheal deviation.  Pulmonary:     Effort: Pulmonary effort is normal.     Breath sounds: No stridor.  Musculoskeletal:     Comments: Wound noted to the right lateral thigh with  no signs of full-thickness injury, obvious superficial embedded metallic specks throughout the wound-no large foreign bodies, no purulence no surrounding redness  Neurological:     Mental Status: He is alert and oriented to person, place, and time.     Coordination: Coordination normal.  Psychiatric:        Behavior: Behavior normal.        Thought Content: Thought content normal.        Judgment: Judgment normal.      ED Treatments / Results  Labs (all labs ordered are listed, but only abnormal results are displayed) Labs Reviewed - No data to display  EKG None  Radiology No results found.  Procedures Procedures (including critical care time)  Medications Ordered in ED Medications  Tdap (BOOSTRIX) injection 0.5 mL (0.5 mLs Intramuscular Given 04/12/19 1836)     Initial Impression / Assessment and Plan / ED Course  I have reviewed the triage vital signs and the nursing notes.  Pertinent labs & imaging results that were  available during my care of the patient were reviewed by me and considered in my medical decision making (see chart for details).         28 year old male presents today with wound to his right lateral thigh.  He does have foreign bodies embedded all throughout these are not amenable to removal they are superficial.  I will place patient on antibiotics.  He will use warm water soaks with light debridement at home.  He will be placed on prophylactic antibiotics, tetanus updated here.  He will return immediately if he develops any new or worsening signs or symptoms.  He verbalized understanding and agreement to today's plan had no further questions or concerns.   Final Clinical Impressions(s) / ED Diagnoses   Final diagnoses:  Open wound of right thigh, initial encounter    ED Discharge Orders         Ordered    cephALEXin (KEFLEX) 500 MG capsule  2 times daily     04/12/19 1832           Okey Regal, Hershal Coria 04/12/19 2017    Sherwood Gambler, MD 04/12/19 [phone]

## 2019-04-12 NOTE — Discharge Instructions (Signed)
Please read attached information. If you experience any new or worsening signs or symptoms please return to the emergency room for evaluation. Please follow-up with your primary care provider or specialist as discussed. Please use medication prescribed only as directed and discontinue taking if you have any concerning signs or symptoms.   °

## 2019-07-01 IMAGING — CR DG WRIST COMPLETE 3+V*L*
4 series · 4 of 4 positions shown · non-contrast
Comparison: None.

CLINICAL DATA: Fall.  Left wrist pain.

EXAM:
LEFT WRIST - COMPLETE 3+ VIEW

[x wrist pa left]
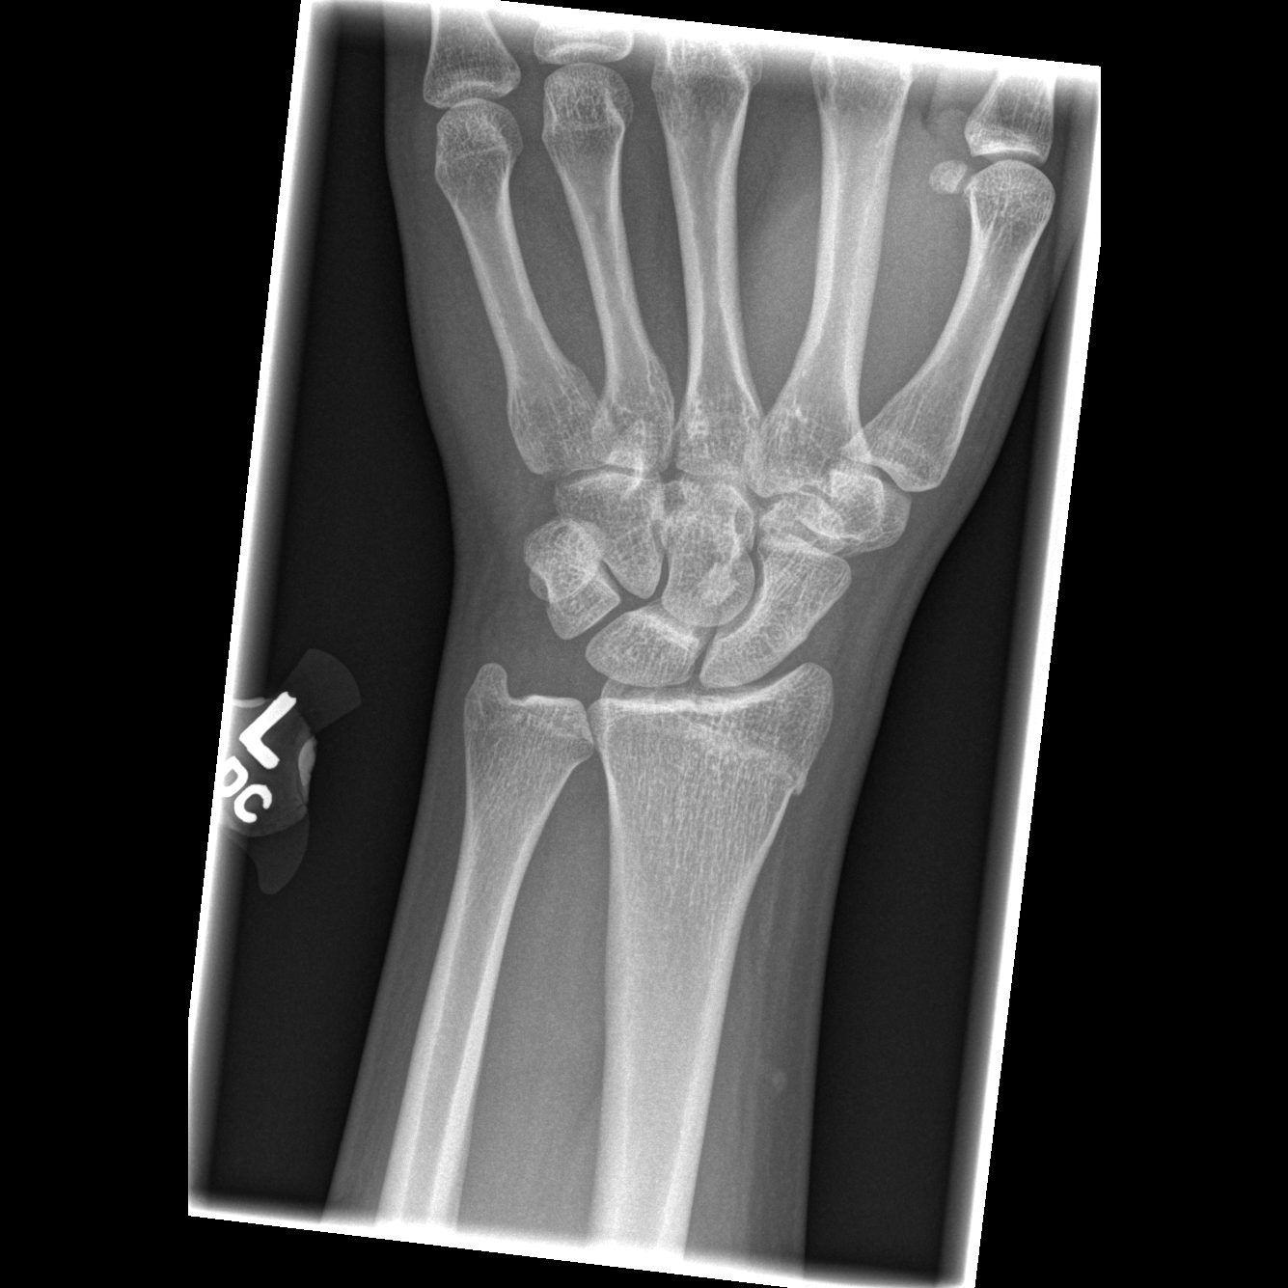

[x wrist obl left]
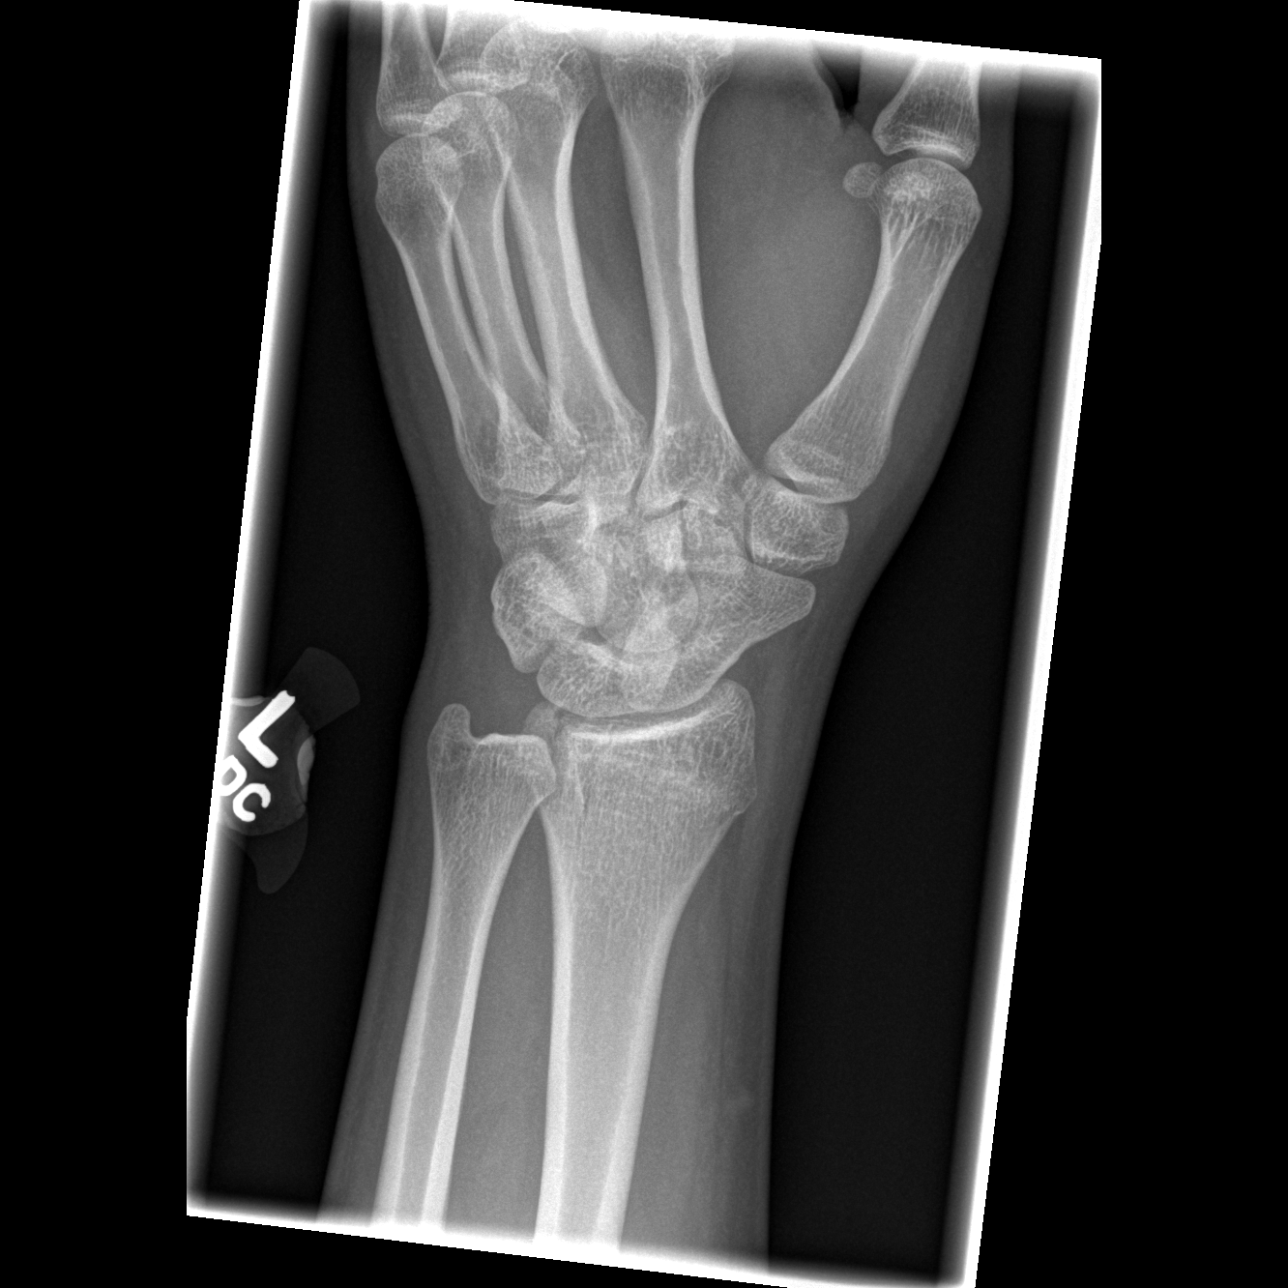

[x wrist lat left]
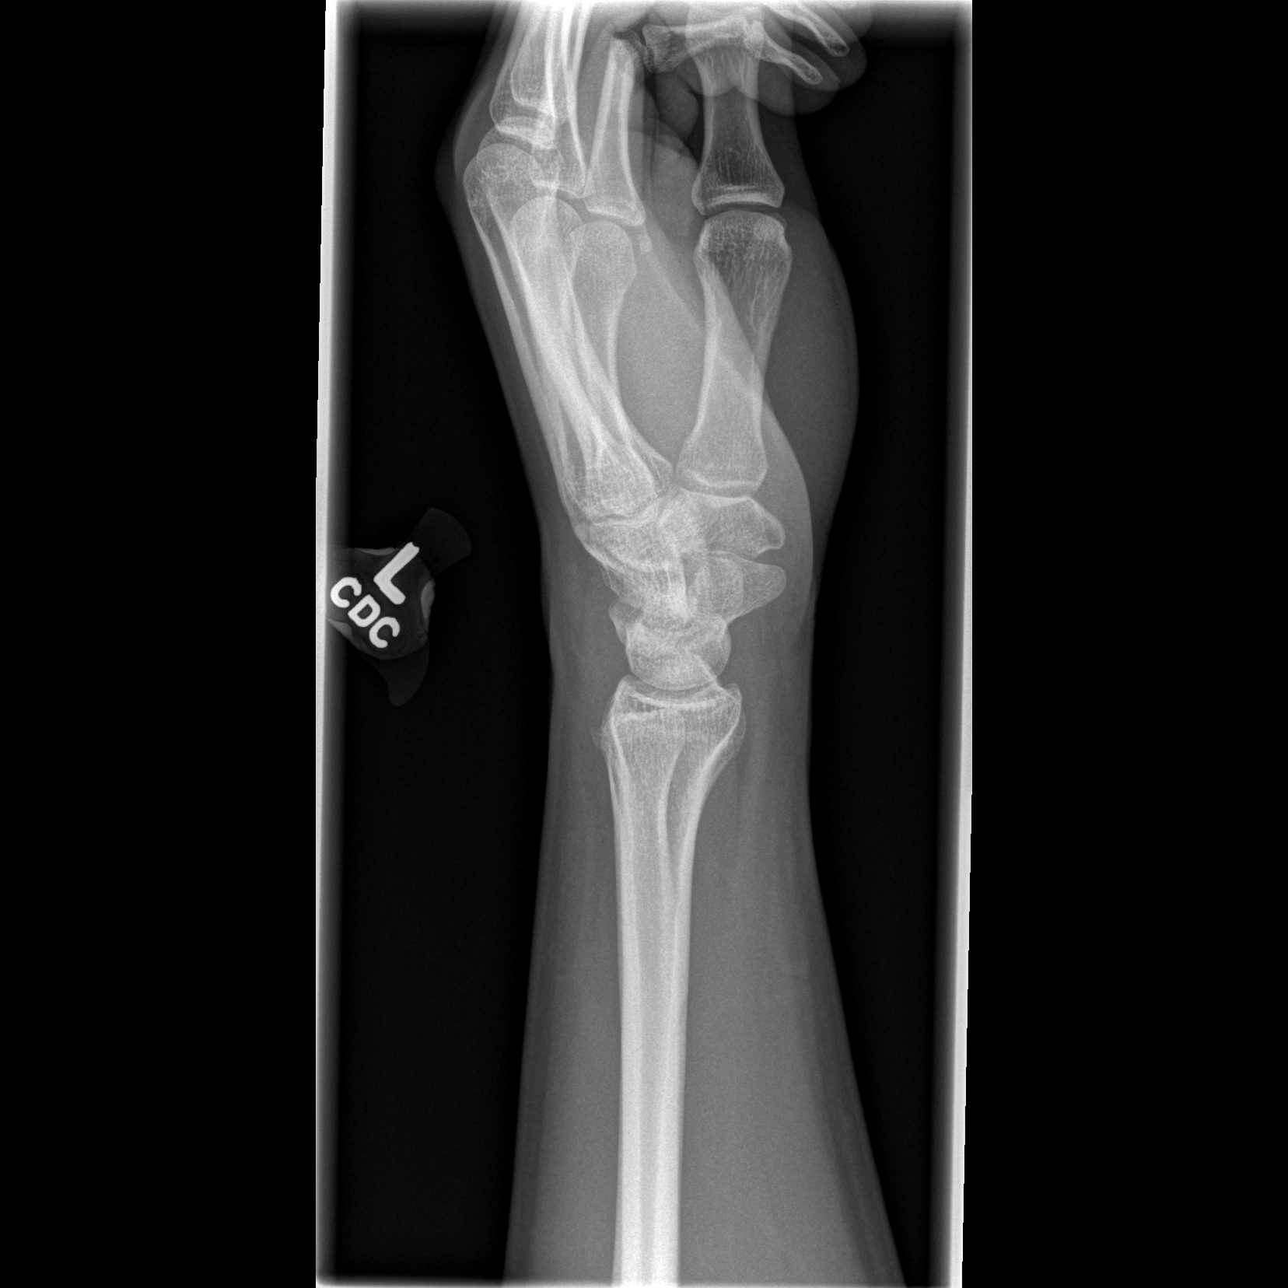

[x navicular]
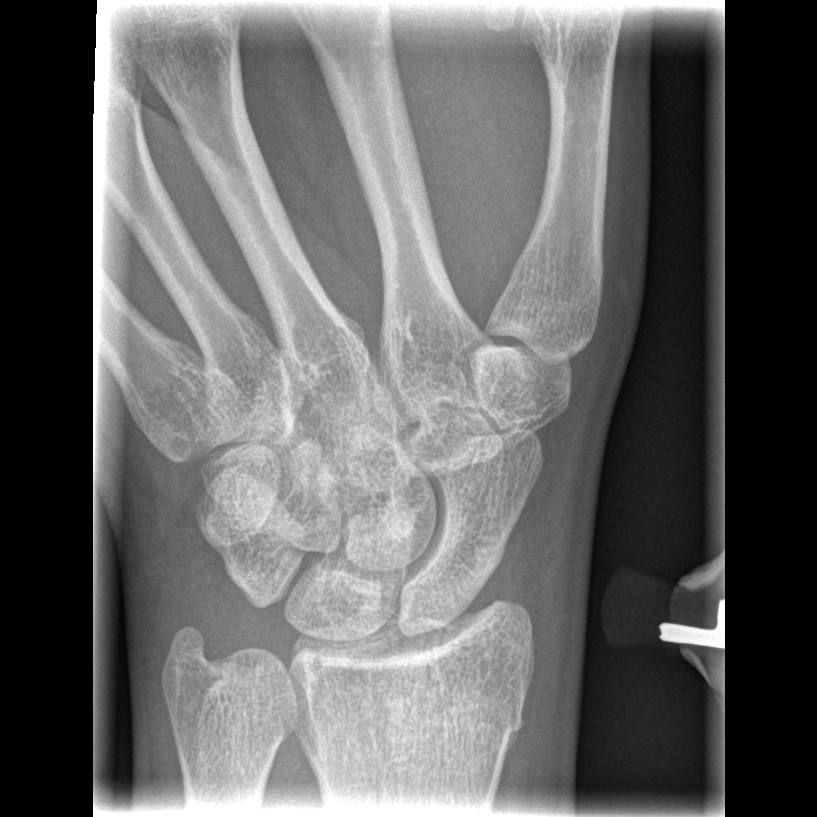

[4 of 4 positions shown; findings below may reference images not displayed]

FINDINGS: There is a distal left radial fracture, nondisplaced. No ulnar
abnormality. No subluxation or dislocation.
IMPRESSION: Nondisplaced intra-articular distal left radial fracture.

## 2019-08-18 ENCOUNTER — Emergency Department (HOSPITAL_BASED_OUTPATIENT_CLINIC_OR_DEPARTMENT_OTHER)
Admission: EM | Admit: 2019-08-18 | Discharge: 2019-08-18 | Disposition: A | Discharge status: Home / Self Care | Payer: No Typology Code available for payment source | Attending: Emergency Medicine | Admitting: Emergency Medicine

## 2019-08-18 ENCOUNTER — Other Ambulatory Visit: Payer: Self-pay

## 2019-08-18 ENCOUNTER — Encounter (HOSPITAL_BASED_OUTPATIENT_CLINIC_OR_DEPARTMENT_OTHER): Payer: Self-pay | Admitting: Emergency Medicine

## 2019-08-18 ENCOUNTER — Emergency Department (HOSPITAL_BASED_OUTPATIENT_CLINIC_OR_DEPARTMENT_OTHER): Payer: 59 | Attending: Emergency Medicine

## 2019-08-18 DIAGNOSIS — Y9289 Other specified places as the place of occurrence of the external cause: Secondary | ICD-10-CM | POA: Diagnosis not present

## 2019-08-18 DIAGNOSIS — X500XXA Overexertion from strenuous movement or load, initial encounter: Secondary | ICD-10-CM | POA: Diagnosis not present

## 2019-08-18 DIAGNOSIS — Y999 Unspecified external cause status: Secondary | ICD-10-CM | POA: Insufficient documentation

## 2019-08-18 DIAGNOSIS — Y9302 Activity, running: Secondary | ICD-10-CM | POA: Diagnosis not present

## 2019-08-18 DIAGNOSIS — Z79899 Other long term (current) drug therapy: Secondary | ICD-10-CM | POA: Diagnosis not present

## 2019-08-18 DIAGNOSIS — S8991XA Unspecified injury of right lower leg, initial encounter: Secondary | ICD-10-CM | POA: Diagnosis present

## 2019-08-18 DIAGNOSIS — S8391XA Sprain of unspecified site of right knee, initial encounter: Secondary | ICD-10-CM | POA: Diagnosis not present

## 2019-08-18 MED ORDER — NAPROXEN 500 MG PO TABS: 500.0000 mg | ORAL_TABLET | Freq: Two times a day (BID) | ORAL | 0 refills | Status: AC

## 2019-08-18 MED FILL — NAPROXEN 500 MG TABS: 500 | 7 days supply | Qty: 14 | Fill #0

## 2019-08-18 NOTE — ED Provider Notes (Addendum)
[facility] EMERGENCY DEPARTMENT Provider Note   CSN: [id number] Arrival date & time: 08/18/19  0947     History   Chief Complaint Chief Complaint  Patient presents with  . Knee Pain    HPI Alexander Combs is a 28 y.o. male.     Patient with a complaint of right knee pain to the lateral aspect of the knee.  Patient was running on Sunday went down into a ditch is somewhat overturned his ankle.  But that feels fine now.  But still has some persistent right knee pain.  Was actually worse on Monday.  And not completely improved.  But patient able to walk on it.  No prior history of knee injury.     Past Medical History:  Diagnosis Date  . GERD (gastroesophageal reflux disease)     There are no active problems to display for this patient.   Past Surgical History:  Procedure Laterality Date  . LASIK          Home Medications    Prior to Admission medications   Medication Sig Start Date End Date Taking? Authorizing Provider  omeprazole (PRILOSEC) 40 MG capsule Take 40 mg by mouth daily. 04/03/19  Yes [provider]  naproxen (NAPROSYN) 500 MG tablet Take 1 tablet (500 mg total) by mouth 2 (two) times daily. 08/18/19   Vanetta Mulders, MD    Family History Family History  Problem Relation Age of Onset  . Hypertension Mother   . Colon cancer Neg Hx   . Rectal cancer Neg Hx   . Esophageal cancer Neg Hx   . Liver cancer Neg Hx     Social History Social History   Tobacco Use  . Smoking status: Never Smoker  . Smokeless tobacco: Never Used  Substance Use Topics  . Alcohol use: Yes    Comment: weekly  . Drug use: No     Allergies   Patient has no known allergies.   Review of Systems Review of Systems  Constitutional: Negative for chills and fever.  HENT: Negative for congestion, rhinorrhea and sore throat.   Eyes: Negative for visual disturbance.  Respiratory: Negative for cough and shortness of breath.   Cardiovascular: Negative  for chest pain and leg swelling.  Gastrointestinal: Negative for abdominal pain, diarrhea, nausea and vomiting.  Genitourinary: Negative for dysuria.  Musculoskeletal: Positive for joint swelling. Negative for back pain and neck pain.  Skin: Negative for rash.  Neurological: Negative for dizziness, light-headedness and headaches.  Hematological: Does not bruise/bleed easily.  Psychiatric/Behavioral: Negative for confusion.     Physical Exam Updated Vital Signs BP 131/82   Pulse 68   Temp 98.7 F (37.1 C) (Oral)   Resp 16   Ht 1.854 m (6\' 1" )   Wt 88.5 kg   SpO2 99%   BMI 25.73 kg/m   Physical Exam Vitals signs and nursing note reviewed.  Constitutional:      Appearance: He is well-developed.  HENT:     Head: Normocephalic and atraumatic.  Eyes:     Conjunctiva/sclera: Conjunctivae normal.  Neck:     Musculoskeletal: Neck supple.  Cardiovascular:     Rate and Rhythm: Normal rate and regular rhythm.     Heart sounds: No murmur.  Pulmonary:     Effort: Pulmonary effort is normal. No respiratory distress.     Breath sounds: Normal breath sounds.  Abdominal:     Palpations: Abdomen is soft.     Tenderness: There is  no abdominal tenderness.  Musculoskeletal: Normal range of motion.        General: Swelling, tenderness and signs of injury present.     Comments: No joint effusion to the right knee.  Patella in the right place.  Some swelling at the lateral joint line.  With tenderness.  Distally neurovascularly intact.  Ankle without any swelling.  Skin:    General: Skin is warm and dry.     Capillary Refill: Capillary refill takes less than 2 seconds.  Neurological:     General: No focal deficit present.     Mental Status: He is alert and oriented to person, place, and time.     Cranial Nerves: No cranial nerve deficit.     Sensory: No sensory deficit.     Motor: No weakness.      ED Treatments / Results  Labs (all labs ordered are listed, but only abnormal  results are displayed) Labs Reviewed - No data to display  EKG None  Radiology Dg Knee Complete 4 Views Right  Result Date: 08/18/2019 CLINICAL DATA:  Right knee injury, pain EXAM: RIGHT KNEE - COMPLETE 4+ VIEW COMPARISON:  None. FINDINGS: No evidence of fracture, dislocation, or joint effusion. No evidence of arthropathy or other focal bone abnormality. Soft tissues are unremarkable. IMPRESSION: Negative. Electronically Signed   By: Rolm Baptise M.D.   On: 08/18/2019 08:53    Procedures Procedures (including critical care time)  Medications Ordered in ED Medications - No data to display   Initial Impression / Assessment and Plan / ED Course  I have reviewed the triage vital signs and the nursing notes.  Pertinent labs & imaging results that were available during my care of the patient were reviewed by me and considered in my medical decision making (see chart for details).        Clinically suggestive of knee strain.  X-rays will be done to rule out any bony abnormality.  Will be treated with knee immobilizer follow-up with sports medicine.  And a course of Naprosyn.  Work note provided because patient works as a [employer] person.  So we will take him out of work until Monday.  X-ray without any acute bony abnormalities or any evidence of dislocation or effusion.  Final Clinical Impressions(s) / ED Diagnoses   Final diagnoses:  Sprain of right knee, unspecified ligament, initial encounter    ED Discharge Orders         Ordered    naproxen (NAPROSYN) 500 MG tablet  2 times daily     08/18/19 0851           Fredia Sorrow, MD 08/18/19 7425    Fredia Sorrow, MD 08/18/19 [phone]

## 2019-08-18 NOTE — Discharge Instructions (Signed)
Wear knee immobilizer at all times for comfort.  Take the Naprosyn as directed.  If things do not improve over the next week.  Would make an appointment to follow-up with sports medicine.

## 2019-08-18 NOTE — ED Triage Notes (Signed)
Pt was running on Saturday and twisted his right knee and ankle.  His ankle is better but his right knee is painful, especially with movement.  Pt ambulated well to room.

## 2021-06-26 ENCOUNTER — Ambulatory Visit: Payer: 59 | Admitting: Podiatry

## 2021-11-22 ENCOUNTER — Other Ambulatory Visit: Payer: Self-pay

## 2021-11-22 ENCOUNTER — Ambulatory Visit: Payer: Self-pay

## 2021-11-22 ENCOUNTER — Other Ambulatory Visit: Payer: Self-pay | Admitting: Family Medicine

## 2021-11-22 DIAGNOSIS — M79645 Pain in left finger(s): Secondary | ICD-10-CM

## 2022-08-06 ENCOUNTER — Ambulatory Visit: Payer: Self-pay

## 2022-08-06 ENCOUNTER — Other Ambulatory Visit: Payer: Self-pay | Admitting: Family Medicine

## 2022-08-06 DIAGNOSIS — M79675 Pain in left toe(s): Secondary | ICD-10-CM

## 2022-11-19 ENCOUNTER — Emergency Department (HOSPITAL_BASED_OUTPATIENT_CLINIC_OR_DEPARTMENT_OTHER)
Admission: EM | Admit: 2022-11-19 | Discharge: 2022-11-19 | Disposition: A | Discharge status: Home / Self Care | Payer: 59 | Attending: Emergency Medicine | Admitting: Emergency Medicine

## 2022-11-19 ENCOUNTER — Emergency Department (HOSPITAL_BASED_OUTPATIENT_CLINIC_OR_DEPARTMENT_OTHER): Payer: 59

## 2022-11-19 ENCOUNTER — Other Ambulatory Visit: Payer: Self-pay

## 2022-11-19 ENCOUNTER — Encounter (HOSPITAL_BASED_OUTPATIENT_CLINIC_OR_DEPARTMENT_OTHER): Payer: Self-pay

## 2022-11-19 DIAGNOSIS — R1032 Left lower quadrant pain: Secondary | ICD-10-CM | POA: Diagnosis present

## 2022-11-19 DIAGNOSIS — K4090 Unilateral inguinal hernia, without obstruction or gangrene, not specified as recurrent: Secondary | ICD-10-CM | POA: Diagnosis not present

## 2022-11-19 LAB — URINALYSIS, ROUTINE W REFLEX MICROSCOPIC
Bilirubin Urine: NEGATIVE
Glucose, UA: NEGATIVE mg/dL
Hgb urine dipstick: NEGATIVE
Ketones, ur: NEGATIVE mg/dL
Leukocytes,Ua: NEGATIVE
Nitrite: NEGATIVE
Protein, ur: NEGATIVE mg/dL
Specific Gravity, Urine: >=1.03 (ref 1.005–1.030)
pH: 5.5 (ref 5.0–8.0)

## 2022-11-19 LAB — COMPREHENSIVE METABOLIC PANEL
ALT: 31 U/L (ref 0–44)
AST: 26 U/L (ref 15–41)
Albumin: 4.4 g/dL (ref 3.5–5.0)
Alkaline Phosphatase: 62 U/L (ref 38–126)
Anion gap: 8 (ref 5–15)
BUN: 10 mg/dL (ref 6–20)
CO2: 26 mmol/L (ref 22–32)
Calcium: 9.1 mg/dL (ref 8.9–10.3)
Chloride: 102 mmol/L (ref 98–111)
Creatinine, Ser: 0.95 mg/dL (ref 0.61–1.24)
GFR, Estimated: >60 mL/min (ref >60)
Glucose, Bld: 108 mg/dL — ABNORMAL HIGH (ref 70–99)
Potassium: 4 mmol/L (ref 3.5–5.1)
Sodium: 136 mmol/L (ref 135–145)
Total Bilirubin: 0.7 mg/dL (ref 0.3–1.2)
Total Protein: 8 g/dL (ref 6.5–8.1)

## 2022-11-19 LAB — CBC
HCT: 43.9 % (ref 39.0–52.0)
Hemoglobin: 14.8 g/dL (ref 13.0–17.0)
MCH: 26.8 pg (ref 26.0–34.0)
MCHC: 33.7 g/dL (ref 30.0–36.0)
MCV: 79.5 fL — ABNORMAL LOW (ref 80.0–100.0)
Platelets: 225 10*3/uL (ref 150–400)
RBC: 5.52 MIL/uL (ref 4.22–5.81)
RDW: 12.4 % (ref 11.5–15.5)
WBC: 4.8 10*3/uL (ref 4.0–10.5)
nRBC: 0 % (ref 0.0–0.2)

## 2022-11-19 LAB — LIPASE, BLOOD: Lipase: 24 U/L (ref 11–51)

## 2022-11-19 MED ORDER — DICYCLOMINE HCL 10 MG PO CAPS
10.0000 mg | ORAL_CAPSULE | Freq: Once | ORAL | Status: AC
  Administered 2022-11-19: 10 mg via ORAL
  Filled 2022-11-19: qty 1

## 2022-11-19 MED ORDER — DICYCLOMINE HCL 20 MG PO TABS: 20.0000 mg | ORAL_TABLET | Freq: Two times a day (BID) | ORAL | 0 refills | Status: AC

## 2022-11-19 MED ORDER — ONDANSETRON HCL 4 MG PO TABS: 4.0000 mg | ORAL_TABLET | Freq: Four times a day (QID) | ORAL | 0 refills | Status: AC | PRN

## 2022-11-19 NOTE — ED Triage Notes (Signed)
Pt c/o lower abdominal pain and nausea x3 days.  Pain score 5/10.  Pt reports stomach feels distended/bloated.  Denies v/d.

## 2022-11-19 NOTE — ED Notes (Signed)
Discharge instructions reviewed with patient. Patient verbalizes understanding, no further questions at this time. Medications/prescriptions and follow up information provided. No acute distress noted at time of departure.  

## 2022-11-19 NOTE — ED Notes (Signed)
Pt. Did report to RN he has felt pain in the past week in the groin area from time to time and in his lower back.  Pt. Also reports he has had some testicle pain off and on with no edema in either testicle.  Pt. Has no urinary urgency and no blood in his stool or in his urine he reports.

## 2022-11-19 NOTE — ED Notes (Addendum)
PA C at bedside with Pt.    Pt. Explains that he has been feeling bloated with abd. Pain worse at night.  Pt. Has history of Abd. Issues H pylori

## 2022-11-19 NOTE — Discharge Instructions (Signed)
As we discussed your workup today revealed a left-sided inguinal hernia.  There is no evidence of incarceration or strangulation which are the emergent complications that we discussed.  Would refrain from significant heavy lifting until you are able to speak to the surgeon about hernia repair.  Please return the emergency department if you have severe worsening pain, swelling of the testicles, or in the left lower quadrant.  Additionally return to the emergency department if you have severe nausea, vomiting, inability to pass stool.  You can try to use the medication that I prescribed as needed to help with abdominal pain.  Resting and decreasing the ability that you spend standing is likely to improve pain related to your hernia.  Please follow-up with the surgeon at your earliest convenience to discuss hernia repair.

## 2022-11-19 NOTE — ED Provider Notes (Signed)
[facility] Provider Note   CSN: [id number] Arrival date & time: 11/19/22  1015     History  Chief Complaint  Patient presents with   Abdominal Pain   Nausea    Alexander Combs is a 32 y.o. male with unremarkable past medical history other than previous history of GERD, H. pylori who presents with concern for left lower abdominal pain, nausea for around 3 days.  He endorses pain is 5/10.  He reports that he feels some bloating in the stomach.  He denies any vomiting, diarrhea, constipation.  He reports no previous similar pain.  He denies any swelling of the testicles.  He denies dysuria, he does endorse some pain.   Abdominal Pain      Home Medications Prior to Admission medications   Medication Sig Start Date End Date Taking? Authorizing Provider  dicyclomine (BENTYL) 20 MG tablet Take 1 tablet (20 mg total) by mouth 2 (two) times daily. 11/19/22  Yes Tashima Scarpulla H, PA-C  ondansetron (ZOFRAN) 4 MG tablet Take 1 tablet (4 mg total) by mouth every 6 (six) hours as needed for nausea or vomiting. 11/19/22  Yes Llewellyn Choplin H, PA-C  naproxen (NAPROSYN) 500 MG tablet Take 1 tablet (500 mg total) by mouth 2 (two) times daily. 08/18/19   Fredia Sorrow, MD  omeprazole (PRILOSEC) 40 MG capsule Take 40 mg by mouth daily. 04/03/19   [provider]      Allergies    Patient has no known allergies.    Review of Systems   Review of Systems  Gastrointestinal:  Positive for abdominal pain.  All other systems reviewed and are negative.   Physical Exam Updated Vital Signs BP (!) 150/93   Pulse 68   Temp 98.6 F (37 C) (Oral)   Resp 18   Ht 6' (1.829 m)   Wt 93 kg   SpO2 98%   BMI 27.80 kg/m  Physical Exam Vitals and nursing note reviewed.  Constitutional:      General: He is not in acute distress.    Appearance: Normal appearance.  HENT:     Head: Normocephalic and atraumatic.  Eyes:     General:         Right eye: No discharge.        Left eye: No discharge.  Cardiovascular:     Rate and Rhythm: Normal rate and regular rhythm.     Heart sounds: No murmur heard.    No friction rub. No gallop.  Pulmonary:     Effort: Pulmonary effort is normal.     Breath sounds: Normal breath sounds.  Abdominal:     General: Bowel sounds are normal.     Palpations: Abdomen is soft.  Genitourinary:    Comments: Patient with normal appearance of bilateral testes, penis, and a very small mass palpated on coughing in the left inguinal canal that is easily reduced, with no evidence of bowel loop.  No evidence of incarceration, strangulation. Skin:    General: Skin is warm and dry.     Capillary Refill: Capillary refill takes less than 2 seconds.  Neurological:     Mental Status: He is alert and oriented to person, place, and time.  Psychiatric:        Mood and Affect: Mood normal.        Behavior: Behavior normal.     ED Results / Procedures / Treatments   Labs (all labs ordered are listed,  but only abnormal results are displayed) Labs Reviewed  COMPREHENSIVE METABOLIC PANEL - Abnormal; Notable for the following components:      Result Value   Glucose, Bld 108 (*)    All other components within normal limits  CBC - Abnormal; Notable for the following components:   MCV 79.5 (*)    All other components within normal limits  LIPASE, BLOOD  URINALYSIS, ROUTINE W REFLEX MICROSCOPIC    EKG None  Radiology CT Renal Stone Study  Result Date: 11/19/2022 CLINICAL DATA:  Abdominal/flank pain, stone suspected. EXAM: CT ABDOMEN AND PELVIS WITHOUT CONTRAST TECHNIQUE: Multidetector CT imaging of the abdomen and pelvis was performed following the standard protocol without IV contrast. RADIATION DOSE REDUCTION: This exam was performed according to the departmental dose-optimization program which includes automated exposure control, adjustment of the mA and/or kV according to patient size and/or use of  iterative reconstruction technique. COMPARISON:  None Available. FINDINGS: Lower chest: Lung bases are clear. Hepatobiliary: Normal appearance of the liver and gallbladder. No biliary dilatation. Pancreas: Unremarkable. No pancreatic ductal dilatation or surrounding inflammatory changes. Spleen: Normal in size without focal abnormality. Adrenals/Urinary Tract: Normal adrenal glands. Negative for kidney stones or hydronephrosis. No ureter stones. Normal appearance of the urinary bladder. No suspicious renal lesions. No ureter dilatation. Stomach/Bowel: Stomach is within normal limits. Appendix appears normal. No evidence of bowel wall thickening, distention, or inflammatory changes. Vascular/Lymphatic: No significant vascular findings are present. No enlarged abdominal or pelvic lymph nodes. Reproductive: Prostate is unremarkable. Other: Left inguinal hernia containing fat. Negative for free fluid. Negative for free air. Musculoskeletal: No acute or significant osseous findings. IMPRESSION: 1. No acute abnormality in the abdomen or pelvis. 2. Negative for kidney stones or hydronephrosis. 3. Left inguinal hernia containing fat. Electronically Signed   By: Markus Daft M.D.   On: 11/19/2022 12:43    Procedures Procedures    Medications Ordered in ED Medications  dicyclomine (BENTYL) capsule 10 mg (10 mg Oral Given 11/19/22 1137)    ED Course/ Medical Decision Making/ A&P                             Medical Decision Making Amount and/or Complexity of Data Reviewed Labs: ordered. Radiology: ordered.  Risk Prescription drug management.   This patient is a 32 y.o. male  who presents to the ED for concern of abdominal pain, primarily Located in the left lower quadrant.   Differential diagnoses prior to evaluation: The emergent differential diagnosis includes, but is not limited to,  The causes of generalized abdominal pain include but are not limited to AAA, mesenteric ischemia, appendicitis,  diverticulitis, DKA, gastritis, gastroenteritis, AMI, nephrolithiasis, pancreatitis, peritonitis, adrenal insufficiency,lead poisoning, iron toxicity, intestinal ischemia, constipation, UTI,SBO/LBO, splenic rupture, biliary disease, IBD, IBS, PUD, or hepatitis, hernia or hernia complication. This is not an exhaustive differential.   Past Medical History / Co-morbidities: Previous treated recurred, H pylori  Additional history: Chart reviewed. Pertinent results include: Reviewed endoscopy from last year  Physical Exam: Physical exam performed. The pertinent findings include: Patient does have small palpable easily reducible mass in the left inguinal crease consistent with fat-containing inguinal hernia, he does have some tenderness to palpation primarily in the left lower quadrant, no McBurney's point tenderness, no rebound, rigidity, guarding throughout  Lab Tests/Imaging studies: I personally interpreted labs/imaging and the pertinent results include: CBC unremarkable, lipase unremarkable, CMP unremarkable, UA negative, and not containing any blood.  Will interpreted CT renal  stone study which shows no notes of nephro lithiasis or any other intra-abdominal or surgical abnormality, but small fat-containing left inguinal hernia is identified, I do think that this could be contributing to patient's pain I agree with the radiologist interpretation.   Medications: I ordered medication including bentyl for abdominal pain, patient reports some improvement of pain.  I have reviewed the patients home medicines and have made adjustments as needed.   Disposition: After consideration of the diagnostic results and the patients response to treatment, I feel that patient's left lower quadrant abdominal pain is likely related to his left inguinal hernia, but he has no signs of incarceration, strangulation, his workup, vital signs are overall unremarkable today.  Encouraged outpatient surgery follow-up, discussed  extensive return precautions, will discharge with Bentyl, Zofran as needed to help with pain control, also considered some noninfectious versus inflammatory gastroenteritis, colitis as possible causes of his symptoms.   emergency department workup does not suggest an emergent condition requiring admission or immediate intervention beyond what has been performed at this time. The plan is: as above. The patient is safe for discharge and has been instructed to return immediately for worsening symptoms, change in symptoms or any other concerns.  Final Clinical Impression(s) / ED Diagnoses Final diagnoses:  Left inguinal hernia  Left lower quadrant abdominal pain    Rx / DC Orders ED Discharge Orders          Ordered    dicyclomine (BENTYL) 20 MG tablet  2 times daily        11/19/22 1302    ondansetron (ZOFRAN) 4 MG tablet  Every 6 hours PRN        11/19/22 1305              Yvonne Stopher, [place] H, PA-C 11/19/22 1312    Audley Hose, MD 11/20/22 1143

## 2023-02-25 ENCOUNTER — Emergency Department (HOSPITAL_BASED_OUTPATIENT_CLINIC_OR_DEPARTMENT_OTHER)
Admission: EM | Admit: 2023-02-25 | Discharge: 2023-02-25 | Disposition: A | Discharge status: Home / Self Care | Payer: 59 | Attending: Emergency Medicine | Admitting: Emergency Medicine

## 2023-02-25 ENCOUNTER — Other Ambulatory Visit: Payer: Self-pay

## 2023-02-25 ENCOUNTER — Encounter (HOSPITAL_BASED_OUTPATIENT_CLINIC_OR_DEPARTMENT_OTHER): Payer: Self-pay

## 2023-02-25 ENCOUNTER — Emergency Department (HOSPITAL_BASED_OUTPATIENT_CLINIC_OR_DEPARTMENT_OTHER): Payer: 59

## 2023-02-25 DIAGNOSIS — K219 Gastro-esophageal reflux disease without esophagitis: Secondary | ICD-10-CM | POA: Insufficient documentation

## 2023-02-25 DIAGNOSIS — A09 Infectious gastroenteritis and colitis, unspecified: Secondary | ICD-10-CM

## 2023-02-25 DIAGNOSIS — R509 Fever, unspecified: Secondary | ICD-10-CM | POA: Diagnosis not present

## 2023-02-25 DIAGNOSIS — R109 Unspecified abdominal pain: Secondary | ICD-10-CM | POA: Diagnosis not present

## 2023-02-25 DIAGNOSIS — R197 Diarrhea, unspecified: Secondary | ICD-10-CM | POA: Diagnosis present

## 2023-02-25 DIAGNOSIS — R110 Nausea: Secondary | ICD-10-CM | POA: Insufficient documentation

## 2023-02-25 DIAGNOSIS — Z1152 Encounter for screening for COVID-19: Secondary | ICD-10-CM | POA: Insufficient documentation

## 2023-02-25 LAB — URINALYSIS, ROUTINE W REFLEX MICROSCOPIC
Glucose, UA: NEGATIVE mg/dL
Ketones, ur: NEGATIVE mg/dL
Leukocytes,Ua: NEGATIVE
Nitrite: NEGATIVE
Protein, ur: 30 mg/dL — AB
Specific Gravity, Urine: 1.025 (ref 1.005–1.030)
pH: 5.5 (ref 5.0–8.0)

## 2023-02-25 LAB — SARS CORONAVIRUS 2 BY RT PCR: SARS Coronavirus 2 by RT PCR: NEGATIVE

## 2023-02-25 LAB — CBC
HCT: 42.5 % (ref 39.0–52.0)
Hemoglobin: 14 g/dL (ref 13.0–17.0)
MCH: 26.5 pg (ref 26.0–34.0)
MCHC: 32.9 g/dL (ref 30.0–36.0)
MCV: 80.3 fL (ref 80.0–100.0)
Platelets: 166 10*3/uL (ref 150–400)
RBC: 5.29 MIL/uL (ref 4.22–5.81)
RDW: 12.5 % (ref 11.5–15.5)
WBC: 5.9 10*3/uL (ref 4.0–10.5)
nRBC: 0 % (ref 0.0–0.2)

## 2023-02-25 LAB — COMPREHENSIVE METABOLIC PANEL
ALT: 30 U/L (ref 0–44)
AST: 29 U/L (ref 15–41)
Albumin: 4 g/dL (ref 3.5–5.0)
Alkaline Phosphatase: 54 U/L (ref 38–126)
Anion gap: 10 (ref 5–15)
BUN: 10 mg/dL (ref 6–20)
CO2: 24 mmol/L (ref 22–32)
Calcium: 8.6 mg/dL — ABNORMAL LOW (ref 8.9–10.3)
Chloride: 101 mmol/L (ref 98–111)
Creatinine, Ser: 1.14 mg/dL (ref 0.61–1.24)
GFR, Estimated: >60 mL/min (ref >60)
Glucose, Bld: 111 mg/dL — ABNORMAL HIGH (ref 70–99)
Potassium: 3.7 mmol/L (ref 3.5–5.1)
Sodium: 135 mmol/L (ref 135–145)
Total Bilirubin: 1 mg/dL (ref 0.3–1.2)
Total Protein: 7.6 g/dL (ref 6.5–8.1)

## 2023-02-25 LAB — URINALYSIS, MICROSCOPIC (REFLEX)

## 2023-02-25 MED ORDER — ONDANSETRON HCL 4 MG/2ML IJ SOLN
4.0000 mg | Freq: Once | INTRAMUSCULAR | Status: AC
  Administered 2023-02-25: 4 mg via INTRAVENOUS
  Filled 2023-02-25: qty 2

## 2023-02-25 MED ORDER — LEVOFLOXACIN 750 MG PO TABS: 750.0000 mg | ORAL_TABLET | Freq: Every day | ORAL | 0 refills | Status: AC

## 2023-02-25 MED ORDER — SODIUM CHLORIDE 0.9 % IV BOLUS
1000.0000 mL | Freq: Once | INTRAVENOUS | Status: AC
  Administered 2023-02-25: 1000 mL via INTRAVENOUS

## 2023-02-25 MED ORDER — DICYCLOMINE HCL 10 MG/ML IM SOLN
20.0000 mg | Freq: Once | INTRAMUSCULAR | Status: AC
  Administered 2023-02-25: 20 mg via INTRAMUSCULAR
  Filled 2023-02-25: qty 2

## 2023-02-25 NOTE — ED Notes (Signed)
Patient transported to CT 

## 2023-02-25 NOTE — ED Provider Notes (Addendum)
[facility] [facility] Provider Note   CSN: [id number] Arrival date & time: 02/25/23  0443     History  Chief Complaint  Patient presents with   Diarrhea   Abdominal Pain    Alexander Combs is a 32 y.o. male.  The history is provided by the patient.  Diarrhea Quality:  Watery Severity:  Severe Onset quality:  Gradual Duration:  4 days Timing:  Intermittent Progression:  Unchanged Relieved by:  Nothing Worsened by:  Nothing Ineffective treatments:  None tried Associated symptoms: abdominal pain and fever   Risk factors: travel to endemic area   Risk factors: no recent antibiotic use and no sick contacts   Risk factors comment:  Travel to [place] Abdominal Pain Pain location:  Generalized Pain quality: cramping   Pain radiates to:  Does not radiate Pain severity:  Moderate Onset quality:  Gradual Timing:  Constant Progression:  Waxing and waning Chronicity:  New Context: recent travel   Context: not trauma   Relieved by:  Nothing Worsened by:  Nothing Ineffective treatments:  None tried Associated symptoms: diarrhea, fever and nausea   Associated symptoms: no cough and no shortness of breath   Risk factors: no recent hospitalization   Patient with GERD who traveled to [place], did not drink water per se but had ice in drinks.  Has had fevers, diarrhea and cramping with some nausea.      Past Medical History:  Diagnosis Date   GERD (gastroesophageal reflux disease)      Home Medications Prior to Admission medications   Medication Sig Start Date End Date Taking? Authorizing Provider  dicyclomine (BENTYL) 20 MG tablet Take 1 tablet (20 mg total) by mouth 2 (two) times daily. 11/19/22   Prosperi, Christian H, PA-C  naproxen (NAPROSYN) 500 MG tablet Take 1 tablet (500 mg total) by mouth 2 (two) times daily. 08/18/19   Vanetta Mulders, MD  omeprazole (PRILOSEC) 40 MG capsule Take 40 mg by mouth daily. 04/03/19   [provider]  ondansetron (ZOFRAN) 4 MG tablet Take 1 tablet (4 mg total) by mouth every 6 (six) hours as needed for nausea or vomiting. 11/19/22   Prosperi, Christian H, PA-C      Allergies    Patient has no known allergies.    Review of Systems   Review of Systems  Constitutional:  Positive for fever.  HENT:  Negative for facial swelling.   Eyes:  Negative for redness.  Respiratory:  Negative for cough and shortness of breath.   Gastrointestinal:  Positive for abdominal pain, diarrhea and nausea.  All other systems reviewed and are negative.   Physical Exam Updated Vital Signs BP (!) 141/74 (BP Location: Right Arm)   Pulse 88   Temp 99.5 F (37.5 C) (Oral)   Resp 18   Ht 6' (1.829 m)   Wt 95.3 kg   SpO2 100%   BMI 28.48 kg/m  Physical Exam Vitals and nursing note reviewed.  Constitutional:      General: He is not in acute distress.    Appearance: Normal appearance. He is well-developed. He is not diaphoretic.  HENT:     Head: Normocephalic and atraumatic.     Nose: Nose normal.  Eyes:     Conjunctiva/sclera: Conjunctivae normal.     Pupils: Pupils are equal, round, and reactive to light.  Cardiovascular:     Rate and Rhythm: Normal rate and regular rhythm.     Pulses: Normal pulses.  Heart sounds: Normal heart sounds.  Pulmonary:     Effort: Pulmonary effort is normal.     Breath sounds: Normal breath sounds. No wheezing or rales.  Abdominal:     General: Abdomen is flat. Bowel sounds are normal.     Palpations: Abdomen is soft. There is no mass.     Tenderness: There is no abdominal tenderness. There is no guarding or rebound.     Hernia: No hernia is present.  Musculoskeletal:        General: Normal range of motion.     Cervical back: Normal range of motion and neck supple.  Skin:    General: Skin is warm and dry.     Capillary Refill: Capillary refill takes less than 2 seconds.  Neurological:     General: No focal deficit present.     Mental Status: He is  alert and oriented to person, place, and time.  Psychiatric:        Mood and Affect: Mood normal.        Behavior: Behavior normal.     ED Results / Procedures / Treatments   Labs (all labs ordered are listed, but only abnormal results are displayed) Results for orders placed or performed during the hospital encounter of 02/25/23  Comprehensive metabolic panel  Result Value Ref Range   Sodium 135 135 - 145 mmol/L   Potassium 3.7 3.5 - 5.1 mmol/L   Chloride 101 98 - 111 mmol/L   CO2 24 22 - 32 mmol/L   Glucose, Bld 111 (H) 70 - 99 mg/dL   BUN 10 6 - 20 mg/dL   Creatinine, Ser 7.82 0.61 - 1.24 mg/dL   Calcium 8.6 (L) 8.9 - 10.3 mg/dL   Total Protein 7.6 6.5 - 8.1 g/dL   Albumin 4.0 3.5 - 5.0 g/dL   AST 29 15 - 41 U/L   ALT 30 0 - 44 U/L   Alkaline Phosphatase 54 38 - 126 U/L   Total Bilirubin 1.0 0.3 - 1.2 mg/dL   GFR, Estimated >95 >62 mL/min   Anion gap 10 5 - 15  CBC  Result Value Ref Range   WBC 5.9 4.0 - 10.5 K/uL   RBC 5.29 4.22 - 5.81 MIL/uL   Hemoglobin 14.0 13.0 - 17.0 g/dL   HCT 13.0 86.5 - 78.4 %   MCV 80.3 80.0 - 100.0 fL   MCH 26.5 26.0 - 34.0 pg   MCHC 32.9 30.0 - 36.0 g/dL   RDW 69.6 29.5 - 28.4 %   Platelets 166 150 - 400 K/uL   nRBC 0.0 0.0 - 0.2 %  Urinalysis, Routine w reflex microscopic -Urine, Clean Catch  Result Value Ref Range   Color, Urine YELLOW YELLOW   APPearance CLEAR CLEAR   Specific Gravity, Urine 1.025 1.005 - 1.030   pH 5.5 5.0 - 8.0   Glucose, UA NEGATIVE NEGATIVE mg/dL   Hgb urine dipstick TRACE (A) NEGATIVE   Bilirubin Urine SMALL (A) NEGATIVE   Ketones, ur NEGATIVE NEGATIVE mg/dL   Protein, ur 30 (A) NEGATIVE mg/dL   Nitrite NEGATIVE NEGATIVE   Leukocytes,Ua NEGATIVE NEGATIVE  Urinalysis, Microscopic (reflex)  Result Value Ref Range   RBC / HPF 0-5 0 - 5 RBC/hpf   WBC, UA 0-5 0 - 5 WBC/hpf   Bacteria, UA RARE (A) NONE SEEN   Squamous Epithelial / HPF 0-5 0 - 5 /HPF   Mucus PRESENT    CT Renal Stone Study  Result Date:  02/25/2023  CLINICAL DATA:  Abdominal/flank pain. Chills and body aches with loose stools. EXAM: CT ABDOMEN AND PELVIS WITHOUT CONTRAST TECHNIQUE: Multidetector CT imaging of the abdomen and pelvis was performed following the standard protocol without IV contrast. RADIATION DOSE REDUCTION: This exam was performed according to the departmental dose-optimization program which includes automated exposure control, adjustment of the mA and/or kV according to patient size and/or use of iterative reconstruction technique. COMPARISON:  11/19/2022 FINDINGS: Lower chest:  No contributory findings. Hepatobiliary: Hepatic steatosis with pericholecystic sparing.No evidence of biliary obstruction or stone. Pancreas: Unremarkable. Spleen: Unremarkable. Adrenals/Urinary Tract: Negative adrenals. No hydronephrosis or stone. Unremarkable bladder. Stomach/Bowel: Prominent distal colonic wall thickness diffusely although collapsed segment. No focal inflammation. No bowel obstruction. No appendicitis. Vascular/Lymphatic: No acute vascular abnormality. No mass or adenopathy. Reproductive:No significant finding. Other: No ascites or pneumoperitoneum. Musculoskeletal: No acute abnormalities. IMPRESSION: Suspect distal colitis. Hepatic steatosis. Electronically Signed   By: Tiburcio Pea M.D.   On: 02/25/2023 06:09     Procedures Procedures    Medications Ordered in ED Medications  sodium chloride 0.9 % bolus 1,000 mL (1,000 mLs Intravenous New Bag/Given 02/25/23 0545)  ondansetron (ZOFRAN) injection 4 mg (4 mg Intravenous Given 02/25/23 0554)  dicyclomine (BENTYL) injection 20 mg (20 mg Intramuscular Given 02/25/23 0556)    ED Course/ Medical Decision Making/ A&P                             Medical Decision Making Patient with travel to [place] and now cramping and diarrhea.  Some nausea  Amount and/or Complexity of Data Reviewed External Data Reviewed: notes.    Details: Previous notes reviewed  Labs: ordered.     Details: Urine is negative for UTI. Normal white count 5.9, normal hemoglobin 14, normal platelet count 166. Normal sodium 135, normal potassium 3.7, normal creatinine 1.14, normal LFTs Radiology: ordered and independent interpretation performed.    Details: No stones or free air on CT by me   Risk Prescription drug management. Risk Details: Resting comfortably.  Patient will need to be on antibiotics for traveler's diarrhea, pharmacist recommends levaquin.  I have given tendon precautions, no sports for 3-4 weeks after Levaquin.  Patient verbalized understanding of these instructions.     Final Clinical Impression(s) / ED Diagnoses Final diagnoses:  None   Signed out to Dr. Anitra Lauth pending IV fluids and reassessment      Elinda Bunten, MD 02/25/23 1610

## 2023-02-25 NOTE — ED Triage Notes (Addendum)
Pt recently took trip to [place], returned Friday evening, and is now feeling chills and body aches. Has loose stools. Pt reports intermittent fevers with highest being 104F. Thinks it is food poisoning. Covid test at home negative. Pt with intermittent nausea. Thinks he is seeing blood in your stool.

## 2023-02-25 NOTE — ED Notes (Signed)
Reviewed discharge instructions , follow up and meds with pt. Pt states understanding. Diarrhea stool prior to discharge. Ambulatory at discharge

## 2023-02-25 NOTE — Discharge Instructions (Addendum)
No sports x 4 weeks.  Peptobismol tablets with antibiotics.

## 2023-07-01 ENCOUNTER — Other Ambulatory Visit: Payer: Self-pay

## 2023-07-01 ENCOUNTER — Emergency Department (HOSPITAL_COMMUNITY): Payer: 59

## 2023-07-01 ENCOUNTER — Encounter (HOSPITAL_COMMUNITY): Payer: Self-pay

## 2023-07-01 ENCOUNTER — Emergency Department (HOSPITAL_COMMUNITY)
Admission: EM | Admit: 2023-07-01 | Discharge: 2023-07-02 | Disposition: A | Discharge status: Home / Self Care | Payer: 59 | Attending: Emergency Medicine | Admitting: Emergency Medicine

## 2023-07-01 DIAGNOSIS — N50811 Right testicular pain: Secondary | ICD-10-CM | POA: Diagnosis present

## 2023-07-01 DIAGNOSIS — N453 Epididymo-orchitis: Secondary | ICD-10-CM | POA: Diagnosis not present

## 2023-07-01 DIAGNOSIS — N451 Epididymitis: Secondary | ICD-10-CM

## 2023-07-01 LAB — URINALYSIS, ROUTINE W REFLEX MICROSCOPIC
Bilirubin Urine: NEGATIVE
Glucose, UA: NEGATIVE mg/dL
Hgb urine dipstick: NEGATIVE
Ketones, ur: NEGATIVE mg/dL
Leukocytes,Ua: NEGATIVE
Nitrite: NEGATIVE
Protein, ur: NEGATIVE mg/dL
Specific Gravity, Urine: 1.017 (ref 1.005–1.030)
pH: 6 (ref 5.0–8.0)

## 2023-07-01 MED ORDER — DOXYCYCLINE HYCLATE 100 MG PO CAPS
100.0000 mg | ORAL_CAPSULE | Freq: Two times a day (BID) | ORAL | 0 refills | Status: AC
  Filled 2023-07-01: qty 20, 10d supply, fill #0

## 2023-07-01 MED ORDER — CEFTRIAXONE SODIUM 1 G IJ SOLR
500.0000 mg | Freq: Once | INTRAMUSCULAR | Status: DC
  Filled 2023-07-01: qty 10

## 2023-07-01 MED ORDER — DEXTROSE 5 % IV SOLN: 500.0000 mg | Freq: Once | INTRAVENOUS | Status: DC

## 2023-07-01 MED ORDER — KETOROLAC TROMETHAMINE 30 MG/ML IJ SOLN
30.0000 mg | Freq: Once | INTRAMUSCULAR | Status: AC
  Administered 2023-07-01: 30 mg via INTRAVENOUS
  Filled 2023-07-01: qty 1

## 2023-07-01 MED ORDER — SODIUM CHLORIDE 0.9 % IV SOLN
1.0000 g | Freq: Once | INTRAVENOUS | Status: AC
  Administered 2023-07-01: 1 g via INTRAVENOUS
  Filled 2023-07-01: qty 10

## 2023-07-01 MED ORDER — DOXYCYCLINE HYCLATE 100 MG PO TABS
100.0000 mg | ORAL_TABLET | Freq: Once | ORAL | Status: AC
  Administered 2023-07-01: 100 mg via ORAL
  Filled 2023-07-01: qty 1

## 2023-07-01 NOTE — ED Provider Notes (Addendum)
[place] [facility] Provider Note   CSN: [id number] Arrival date & time: 07/01/23  2040     History  Chief Complaint  Patient presents with   Testicle Pain    Alexander Combs is a 32 y.o. male.  Otherwise healthy 32 year old male here today with right testicular pain.  Patient says that it was present when he awoke this morning.  Denies any trauma to the area, no new sexual partners, no urinary symptoms, no penile discharge.   Testicle Pain       Home Medications Prior to Admission medications   Medication Sig Start Date End Date Taking? Authorizing Provider  doxycycline (VIBRAMYCIN) 100 MG capsule Take 1 capsule (100 mg total) by mouth 2 (two) times daily for 10 days. 07/01/23 07/11/23 Yes Anders Simmonds T, DO  dicyclomine (BENTYL) 20 MG tablet Take 1 tablet (20 mg total) by mouth 2 (two) times daily. 11/19/22   Prosperi, Christian H, PA-C  levofloxacin (LEVAQUIN) 750 MG tablet Take 1 tablet (750 mg total) by mouth daily. 02/25/23   Palumbo, April, MD  naproxen (NAPROSYN) 500 MG tablet Take 1 tablet (500 mg total) by mouth 2 (two) times daily. 08/18/19   Vanetta Mulders, MD  omeprazole (PRILOSEC) 40 MG capsule Take 40 mg by mouth daily. 04/03/19   [provider]  ondansetron (ZOFRAN) 4 MG tablet Take 1 tablet (4 mg total) by mouth every 6 (six) hours as needed for nausea or vomiting. 11/19/22   Prosperi, Christian H, PA-C      Allergies    Patient has no known allergies.    Review of Systems   Review of Systems  Genitourinary:  Positive for testicular pain.    Physical Exam Updated Vital Signs BP (!) 146/86 (BP Location: Right Arm)   Pulse 92   Temp 99.7 F (37.6 C) (Oral)   Resp 15   Ht 6' (1.829 m)   Wt 95.3 kg   SpO2 98%   BMI 28.49 kg/m  Physical Exam Vitals reviewed.  Genitourinary:    Penis: Normal.      Comments: Normal testicular lie, normal cremasteric reflex.  Patient with tenderness in the right  epididymis.  No swelling.    ED Results / Procedures / Treatments   Labs (all labs ordered are listed, but only abnormal results are displayed) Labs Reviewed  URINALYSIS, ROUTINE W REFLEX MICROSCOPIC  GC/CHLAMYDIA PROBE AMP ([place]) NOT AT [facility]    EKG None  Radiology No results found.  Procedures Procedures    Medications Ordered in ED Medications  doxycycline (VIBRA-TABS) tablet 100 mg (has no administration in time range)  cefTRIAXone (ROCEPHIN) 500 mg in dextrose 5 % 50 mL IVPB (has no administration in time range)  ketorolac (TORADOL) 30 MG/ML injection 30 mg (30 mg Intravenous Given 07/01/23 2155)    ED Course/ Medical Decision Making/ A&P                                 Medical Decision Making 32 year old male here today with right sided testicular pain.  Differential diagnoses include epididymitis, less likely torsion.  Plan-patient's exam is most consistent with epididymitis.  Will obtain ultrasound.  Will provide patient with some Toradol.  Will plan for antibiotics.  Gonorrhea chlamydia ordered, patient denies any new sexual partners, or urinary symptoms.  Reassessment-patient ultrasound showed right-sided orchitis.  Will treat with doxycycline given patient's age.  Will  have the patient follow-up with urology.  Amount and/or Complexity of Data Reviewed Labs: ordered. Radiology: ordered.  Risk Prescription drug management.           Final Clinical Impression(s) / ED Diagnoses Final diagnoses:  Epididymitis    Rx / DC Orders ED Discharge Orders          Ordered    doxycycline (VIBRAMYCIN) 100 MG capsule  2 times daily        07/01/23 2256              Anders Simmonds T, DO 07/01/23 2318    Anders Simmonds T, DO 07/01/23 2323

## 2023-07-01 NOTE — Discharge Instructions (Addendum)
You were diagnosed with orchitis/epididymitis.  This is a inflammation, often caused by infection in the epididymis which is part of the testicle.  You can take doxycycline 2 times per day for the next 10 days.    Included in your discharge instructions the telephone number for urologist.  Please give them a call, tell them you were diagnosed with orchitis in the emergency department.  Try to wear supportive underwear.  You can take Tylenol and ibuprofen for pain.  Come back to the emergency department if you develop increasing pain, or fever.  The antibiotic should begin to make improvement over the next 2 days.

## 2023-07-01 NOTE — ED Triage Notes (Signed)
Pt reports with right testicle pain, swelling, and redness since yesterday.

## 2023-07-02 ENCOUNTER — Other Ambulatory Visit (HOSPITAL_BASED_OUTPATIENT_CLINIC_OR_DEPARTMENT_OTHER): Payer: Self-pay

## 2023-07-02 LAB — GC/CHLAMYDIA PROBE AMP (~~LOC~~) NOT AT ARMC
Chlamydia: NEGATIVE
Comment: NEGATIVE
Comment: NORMAL
Neisseria Gonorrhea: NEGATIVE
